# Patient Record
Sex: Male | Born: 1955 | Race: White | Hispanic: No | Marital: Married | Smoking: Former smoker
Health system: Southern US, Community
[De-identification: ages and names within clinical notes are randomized; demographics above are authoritative.]

## PROBLEM LIST (undated history)

## (undated) DIAGNOSIS — E119 Type 2 diabetes mellitus without complications: Secondary | ICD-10-CM

## (undated) DIAGNOSIS — E7800 Pure hypercholesterolemia, unspecified: Secondary | ICD-10-CM

## (undated) DIAGNOSIS — L88 Pyoderma gangrenosum: Secondary | ICD-10-CM

## (undated) DIAGNOSIS — M5430 Sciatica, unspecified side: Secondary | ICD-10-CM

## (undated) DIAGNOSIS — N200 Calculus of kidney: Secondary | ICD-10-CM

## (undated) DIAGNOSIS — F419 Anxiety disorder, unspecified: Secondary | ICD-10-CM

## (undated) DIAGNOSIS — I10 Essential (primary) hypertension: Secondary | ICD-10-CM

## (undated) DIAGNOSIS — K5190 Ulcerative colitis, unspecified, without complications: Secondary | ICD-10-CM

## (undated) HISTORY — PX: LEG SURGERY: SHX1003

## (undated) HISTORY — PX: BACK SURGERY: SHX140

---

## 2018-01-25 ENCOUNTER — Ambulatory Visit (HOSPITAL_COMMUNITY)
Admit: 2018-01-25 | Discharge: 2018-01-25 | Disposition: A | Discharge status: Home / Self Care | Payer: PRIVATE HEALTH INSURANCE | Attending: Internal Medicine | Admitting: Internal Medicine

## 2018-01-25 ENCOUNTER — Emergency Department (HOSPITAL_COMMUNITY): Payer: PRIVATE HEALTH INSURANCE

## 2018-01-25 ENCOUNTER — Inpatient Hospital Stay (HOSPITAL_COMMUNITY)
Admission: EM | Admit: 2018-01-25 | Discharge: 2018-01-28 | DRG: 386 | Disposition: A | Discharge status: Home / Self Care | Payer: PRIVATE HEALTH INSURANCE | Attending: Internal Medicine | Admitting: Internal Medicine

## 2018-01-25 ENCOUNTER — Other Ambulatory Visit: Payer: Self-pay

## 2018-01-25 ENCOUNTER — Other Ambulatory Visit (HOSPITAL_COMMUNITY): Payer: Self-pay

## 2018-01-25 ENCOUNTER — Encounter (HOSPITAL_COMMUNITY): Payer: Self-pay | Admitting: Emergency Medicine

## 2018-01-25 DIAGNOSIS — E11622 Type 2 diabetes mellitus with other skin ulcer: Secondary | ICD-10-CM | POA: Diagnosis not present

## 2018-01-25 DIAGNOSIS — E11649 Type 2 diabetes mellitus with hypoglycemia without coma: Secondary | ICD-10-CM | POA: Diagnosis not present

## 2018-01-25 DIAGNOSIS — L03116 Cellulitis of left lower limb: Secondary | ICD-10-CM | POA: Diagnosis present

## 2018-01-25 DIAGNOSIS — E785 Hyperlipidemia, unspecified: Secondary | ICD-10-CM | POA: Diagnosis present

## 2018-01-25 DIAGNOSIS — R748 Abnormal levels of other serum enzymes: Secondary | ICD-10-CM | POA: Diagnosis not present

## 2018-01-25 DIAGNOSIS — Z791 Long term (current) use of non-steroidal anti-inflammatories (NSAID): Secondary | ICD-10-CM

## 2018-01-25 DIAGNOSIS — L98499 Non-pressure chronic ulcer of skin of other sites with unspecified severity: Secondary | ICD-10-CM | POA: Diagnosis not present

## 2018-01-25 DIAGNOSIS — I110 Hypertensive heart disease with heart failure: Secondary | ICD-10-CM | POA: Diagnosis present

## 2018-01-25 DIAGNOSIS — I350 Nonrheumatic aortic (valve) stenosis: Secondary | ICD-10-CM | POA: Diagnosis present

## 2018-01-25 DIAGNOSIS — K922 Gastrointestinal hemorrhage, unspecified: Secondary | ICD-10-CM | POA: Diagnosis present

## 2018-01-25 DIAGNOSIS — E119 Type 2 diabetes mellitus without complications: Secondary | ICD-10-CM | POA: Diagnosis not present

## 2018-01-25 DIAGNOSIS — N179 Acute kidney failure, unspecified: Secondary | ICD-10-CM | POA: Diagnosis present

## 2018-01-25 DIAGNOSIS — I248 Other forms of acute ischemic heart disease: Secondary | ICD-10-CM | POA: Diagnosis present

## 2018-01-25 DIAGNOSIS — L080 Pyoderma: Secondary | ICD-10-CM | POA: Insufficient documentation

## 2018-01-25 DIAGNOSIS — Z87442 Personal history of urinary calculi: Secondary | ICD-10-CM | POA: Diagnosis not present

## 2018-01-25 DIAGNOSIS — Z9641 Presence of insulin pump (external) (internal): Secondary | ICD-10-CM | POA: Diagnosis present

## 2018-01-25 DIAGNOSIS — K254 Chronic or unspecified gastric ulcer with hemorrhage: Secondary | ICD-10-CM | POA: Diagnosis not present

## 2018-01-25 DIAGNOSIS — D62 Acute posthemorrhagic anemia: Secondary | ICD-10-CM | POA: Diagnosis present

## 2018-01-25 DIAGNOSIS — M79605 Pain in left leg: Secondary | ICD-10-CM | POA: Diagnosis present

## 2018-01-25 DIAGNOSIS — L88 Pyoderma gangrenosum: Secondary | ICD-10-CM | POA: Diagnosis present

## 2018-01-25 DIAGNOSIS — Z9111 Patient's noncompliance with dietary regimen: Secondary | ICD-10-CM

## 2018-01-25 DIAGNOSIS — R000 Tachycardia, unspecified: Secondary | ICD-10-CM | POA: Diagnosis present

## 2018-01-25 DIAGNOSIS — Z794 Long term (current) use of insulin: Secondary | ICD-10-CM

## 2018-01-25 DIAGNOSIS — F419 Anxiety disorder, unspecified: Secondary | ICD-10-CM

## 2018-01-25 DIAGNOSIS — I5032 Chronic diastolic (congestive) heart failure: Secondary | ICD-10-CM | POA: Diagnosis present

## 2018-01-25 DIAGNOSIS — Z7901 Long term (current) use of anticoagulants: Secondary | ICD-10-CM | POA: Diagnosis not present

## 2018-01-25 DIAGNOSIS — K51911 Ulcerative colitis, unspecified with rectal bleeding: Secondary | ICD-10-CM | POA: Diagnosis present

## 2018-01-25 DIAGNOSIS — I4891 Unspecified atrial fibrillation: Secondary | ICD-10-CM

## 2018-01-25 DIAGNOSIS — D649 Anemia, unspecified: Secondary | ICD-10-CM | POA: Diagnosis not present

## 2018-01-25 DIAGNOSIS — L539 Erythematous condition, unspecified: Secondary | ICD-10-CM | POA: Insufficient documentation

## 2018-01-25 DIAGNOSIS — E7800 Pure hypercholesterolemia, unspecified: Secondary | ICD-10-CM | POA: Diagnosis not present

## 2018-01-25 DIAGNOSIS — Z87891 Personal history of nicotine dependence: Secondary | ICD-10-CM

## 2018-01-25 DIAGNOSIS — I10 Essential (primary) hypertension: Secondary | ICD-10-CM

## 2018-01-25 DIAGNOSIS — I959 Hypotension, unspecified: Secondary | ICD-10-CM | POA: Diagnosis present

## 2018-01-25 DIAGNOSIS — I342 Nonrheumatic mitral (valve) stenosis: Secondary | ICD-10-CM | POA: Diagnosis not present

## 2018-01-25 DIAGNOSIS — K5190 Ulcerative colitis, unspecified, without complications: Secondary | ICD-10-CM | POA: Diagnosis not present

## 2018-01-25 DIAGNOSIS — M7989 Other specified soft tissue disorders: Secondary | ICD-10-CM | POA: Diagnosis present

## 2018-01-25 HISTORY — DX: Essential (primary) hypertension: I10

## 2018-01-25 HISTORY — DX: Type 2 diabetes mellitus without complications: E11.9

## 2018-01-25 HISTORY — DX: Pyoderma gangrenosum: L88

## 2018-01-25 HISTORY — DX: Anxiety disorder, unspecified: F41.9

## 2018-01-25 HISTORY — DX: Sciatica, unspecified side: M54.30

## 2018-01-25 HISTORY — DX: Calculus of kidney: N20.0

## 2018-01-25 HISTORY — DX: Pure hypercholesterolemia, unspecified: E78.00

## 2018-01-25 HISTORY — DX: Ulcerative colitis, unspecified, without complications: K51.90

## 2018-01-25 LAB — TSH: TSH: 4.913 u[IU]/mL — ABNORMAL HIGH (ref 0.350–4.500)

## 2018-01-25 LAB — CBC WITH DIFFERENTIAL/PLATELET
Basophils Absolute: 0 10*3/uL (ref 0.0–0.1)
Basophils Absolute: 0 10*3/uL (ref 0.0–0.1)
Basophils Relative: 0 %
Basophils Relative: 0 %
EOS ABS: 0 10*3/uL (ref 0.0–0.7)
EOS ABS: 0 10*3/uL (ref 0.0–0.7)
EOS PCT: 0 %
Eosinophils Relative: 0 %
HCT: 18.1 % — ABNORMAL LOW (ref 39.0–52.0)
HCT: 19.7 % — ABNORMAL LOW (ref 39.0–52.0)
Hemoglobin: 5.5 g/dL — CL (ref 13.0–17.0)
Hemoglobin: 6.5 g/dL — CL (ref 13.0–17.0)
LYMPHS ABS: 2.1 10*3/uL (ref 0.7–4.0)
Lymphocytes Relative: 23 %
Lymphocytes Relative: 12 %
Lymphs Abs: 1 10*3/uL (ref 0.7–4.0)
MCH: 33.3 pg (ref 26.0–34.0)
MCH: 33 pg (ref 26.0–34.0)
MCHC: 30.4 g/dL (ref 30.0–36.0)
MCHC: 33 g/dL (ref 30.0–36.0)
MCV: 109.7 fL — AB (ref 78.0–100.0)
MCV: 100 fL (ref 78.0–100.0)
MONO ABS: 1.5 10*3/uL — AB (ref 0.1–1.0)
MONO ABS: 1 10*3/uL (ref 0.1–1.0)
Monocytes Relative: 16 %
Monocytes Relative: 13 %
NEUTROS ABS: 6 10*3/uL (ref 1.7–7.7)
NEUTROS PCT: 61 %
Neutro Abs: 5.5 10*3/uL (ref 1.7–7.7)
Neutrophils Relative %: 75 %
PLATELETS: 300 10*3/uL (ref 150–400)
PLATELETS: 226 10*3/uL (ref 150–400)
RBC: 1.65 MIL/uL — ABNORMAL LOW (ref 4.22–5.81)
RBC: 1.97 MIL/uL — ABNORMAL LOW (ref 4.22–5.81)
RDW: 16.1 % — AB (ref 11.5–15.5)
RDW: 21.1 % — ABNORMAL HIGH (ref 11.5–15.5)
WBC: 9.1 10*3/uL (ref 4.0–10.5)
WBC: 8 10*3/uL (ref 4.0–10.5)

## 2018-01-25 LAB — BASIC METABOLIC PANEL
Anion gap: 13 (ref 5–15)
BUN: 44 mg/dL — AB (ref 6–20)
CALCIUM: 8.4 mg/dL — AB (ref 8.9–10.3)
CO2: 24 mmol/L (ref 22–32)
CREATININE: 1.5 mg/dL — AB (ref 0.61–1.24)
Chloride: 101 mmol/L (ref 101–111)
GFR calc Af Amer: 56 mL/min — ABNORMAL LOW (ref >60)
GFR, EST NON AFRICAN AMERICAN: 48 mL/min — AB (ref >60)
GLUCOSE: 119 mg/dL — AB (ref 65–99)
POTASSIUM: 3.5 mmol/L (ref 3.5–5.1)
Sodium: 138 mmol/L (ref 135–145)

## 2018-01-25 LAB — GLUCOSE, CAPILLARY
GLUCOSE-CAPILLARY: 246 mg/dL — AB (ref 65–99)
Glucose-Capillary: 189 mg/dL — ABNORMAL HIGH (ref 65–99)

## 2018-01-25 LAB — CBG MONITORING, ED
Glucose-Capillary: 98 mg/dL (ref 65–99)
Glucose-Capillary: 231 mg/dL — ABNORMAL HIGH (ref 65–99)

## 2018-01-25 LAB — POC OCCULT BLOOD, ED: Fecal Occult Bld: POSITIVE — AB

## 2018-01-25 LAB — ABO/RH: ABO/RH(D): O POS

## 2018-01-25 LAB — PREPARE RBC (CROSSMATCH)

## 2018-01-25 LAB — SEDIMENTATION RATE: SED RATE: 101 mm/h — AB (ref 0–16)

## 2018-01-25 MED ORDER — ALPRAZOLAM 0.5 MG PO TABS: 1.0000 mg | ORAL_TABLET | Freq: Two times a day (BID) | ORAL | Status: DC | PRN

## 2018-01-25 MED ORDER — CLINDAMYCIN PHOSPHATE 600 MG/50ML IV SOLN
600.0000 mg | Freq: Three times a day (TID) | INTRAVENOUS | Status: DC
  Administered 2018-01-25 – 2018-01-26 (×3): 600 mg via INTRAVENOUS
  Filled 2018-01-25 (×4): qty 50

## 2018-01-25 MED ORDER — LORAZEPAM 1 MG PO TABS
0.0000 mg | ORAL_TABLET | Freq: Two times a day (BID) | ORAL | Status: DC
  Filled 2018-01-25: qty 1

## 2018-01-25 MED ORDER — LIDOCAINE HCL 2 % EX GEL
1.0000 "application " | Freq: Once | CUTANEOUS | Status: AC
  Administered 2018-01-25: 1 via TOPICAL
  Filled 2018-01-25: qty 5

## 2018-01-25 MED ORDER — VITAMIN B-1 100 MG PO TABS
100.0000 mg | ORAL_TABLET | Freq: Every day | ORAL | Status: DC
  Administered 2018-01-25 – 2018-01-28 (×4): 100 mg via ORAL
  Filled 2018-01-25 (×4): qty 1

## 2018-01-25 MED ORDER — POLYETHYLENE GLYCOL 3350 17 G PO PACK: 17.0000 g | PACK | Freq: Every day | ORAL | Status: DC | PRN

## 2018-01-25 MED ORDER — SODIUM CHLORIDE 0.9 % IV BOLUS: 1000.0000 mL | Freq: Once | INTRAVENOUS | Status: DC

## 2018-01-25 MED ORDER — LORAZEPAM 2 MG/ML IJ SOLN: 1.0000 mg | Freq: Four times a day (QID) | INTRAMUSCULAR | Status: AC | PRN

## 2018-01-25 MED ORDER — LORAZEPAM 1 MG PO TABS
1.0000 mg | ORAL_TABLET | Freq: Four times a day (QID) | ORAL | Status: AC | PRN
  Administered 2018-01-25 (×2): 1 mg via ORAL
  Filled 2018-01-25 (×2): qty 1

## 2018-01-25 MED ORDER — HYDROMORPHONE HCL 1 MG/ML IJ SOLN
1.0000 mg | Freq: Once | INTRAMUSCULAR | Status: AC
  Administered 2018-01-25: 1 mg via INTRAVENOUS
  Filled 2018-01-25: qty 1

## 2018-01-25 MED ORDER — MERCAPTOPURINE 50 MG PO TABS
75.0000 mg | ORAL_TABLET | Freq: Every day | ORAL | Status: DC
  Administered 2018-01-25 – 2018-01-28 (×4): 75 mg via ORAL
  Filled 2018-01-25 (×4): qty 2

## 2018-01-25 MED ORDER — ACETAMINOPHEN 650 MG RE SUPP: 650.0000 mg | Freq: Four times a day (QID) | RECTAL | Status: DC | PRN

## 2018-01-25 MED ORDER — TACROLIMUS 0.1 % EX OINT
1.0000 "application " | TOPICAL_OINTMENT | Freq: Two times a day (BID) | CUTANEOUS | Status: DC
  Administered 2018-01-26 – 2018-01-27 (×3): 1 via TOPICAL

## 2018-01-25 MED ORDER — SODIUM CHLORIDE 0.9 % IV SOLN
10.0000 mL/h | Freq: Once | INTRAVENOUS | Status: AC
  Administered 2018-01-25: 10 mL/h via INTRAVENOUS

## 2018-01-25 MED ORDER — ACETAMINOPHEN 325 MG PO TABS: 650.0000 mg | ORAL_TABLET | Freq: Four times a day (QID) | ORAL | Status: DC | PRN

## 2018-01-25 MED ORDER — COLLAGENASE 250 UNIT/GM EX OINT
1.0000 "application " | TOPICAL_OINTMENT | Freq: Every day | CUTANEOUS | Status: DC
  Administered 2018-01-26 – 2018-01-27 (×2): 1 via TOPICAL
  Filled 2018-01-25 (×2): qty 90

## 2018-01-25 MED ORDER — THIAMINE HCL 100 MG/ML IJ SOLN: 100.0000 mg | Freq: Every day | INTRAMUSCULAR | Status: DC

## 2018-01-25 MED ORDER — ATORVASTATIN CALCIUM 10 MG PO TABS
10.0000 mg | ORAL_TABLET | Freq: Every day | ORAL | Status: DC
  Administered 2018-01-25 – 2018-01-28 (×4): 10 mg via ORAL
  Filled 2018-01-25 (×5): qty 1

## 2018-01-25 MED ORDER — HYDROCORTISONE 2.5 % EX CREA: 1.0000 "application " | TOPICAL_CREAM | Freq: Two times a day (BID) | CUTANEOUS | Status: DC

## 2018-01-25 MED ORDER — BUDESONIDE 3 MG PO CPEP
9.0000 mg | ORAL_CAPSULE | Freq: Every day | ORAL | Status: DC
  Administered 2018-01-25 – 2018-01-28 (×4): 9 mg via ORAL
  Filled 2018-01-25 (×4): qty 3

## 2018-01-25 MED ORDER — OXYCODONE HCL 5 MG PO TABS
5.0000 mg | ORAL_TABLET | ORAL | Status: DC | PRN
Start: 2018-01-25 — End: 2018-01-26
  Administered 2018-01-25 – 2018-01-26 (×6): 5 mg via ORAL
  Filled 2018-01-25 (×6): qty 1

## 2018-01-25 MED ORDER — INSULIN PUMP
1.0000 | SUBCUTANEOUS | Status: DC
  Administered 2018-01-25: 1 via SUBCUTANEOUS
  Filled 2018-01-25: qty 1

## 2018-01-25 MED ORDER — INSULIN PUMP
Freq: Three times a day (TID) | SUBCUTANEOUS | Status: DC
  Administered 2018-01-25 – 2018-01-26 (×2): via SUBCUTANEOUS
  Filled 2018-01-25: qty 1

## 2018-01-25 MED ORDER — HYDROCORTISONE 1 % EX CREA
TOPICAL_CREAM | Freq: Two times a day (BID) | CUTANEOUS | Status: DC
  Administered 2018-01-26: 1 via TOPICAL
  Administered 2018-01-27 (×2): via TOPICAL
  Filled 2018-01-25 (×2): qty 28

## 2018-01-25 MED ORDER — FOLIC ACID 1 MG PO TABS
1.0000 mg | ORAL_TABLET | Freq: Every day | ORAL | Status: DC
  Administered 2018-01-25 – 2018-01-28 (×4): 1 mg via ORAL
  Filled 2018-01-25 (×4): qty 1

## 2018-01-25 MED ORDER — ADULT MULTIVITAMIN W/MINERALS CH
1.0000 | ORAL_TABLET | Freq: Every day | ORAL | Status: DC
  Administered 2018-01-25 – 2018-01-28 (×4): 1 via ORAL
  Filled 2018-01-25 (×4): qty 1

## 2018-01-25 MED ORDER — GABAPENTIN 400 MG PO CAPS
800.0000 mg | ORAL_CAPSULE | Freq: Three times a day (TID) | ORAL | Status: DC
  Administered 2018-01-25 (×2): 800 mg via ORAL
  Filled 2018-01-25 (×3): qty 2

## 2018-01-25 MED ORDER — PANTOPRAZOLE SODIUM 40 MG PO TBEC
40.0000 mg | DELAYED_RELEASE_TABLET | Freq: Two times a day (BID) | ORAL | Status: DC
  Administered 2018-01-25 – 2018-01-28 (×6): 40 mg via ORAL
  Filled 2018-01-25 (×6): qty 1

## 2018-01-25 MED ORDER — LORAZEPAM 1 MG PO TABS
0.0000 mg | ORAL_TABLET | Freq: Four times a day (QID) | ORAL | Status: DC
  Administered 2018-01-27: 1 mg via ORAL
  Filled 2018-01-25: qty 1

## 2018-01-25 MED ORDER — BALSALAZIDE DISODIUM 750 MG PO CAPS
2250.0000 mg | ORAL_CAPSULE | Freq: Three times a day (TID) | ORAL | Status: DC
Start: 2018-01-25 — End: 2018-01-28
  Administered 2018-01-25 – 2018-01-28 (×8): 2250 mg via ORAL
  Filled 2018-01-25 (×13): qty 3

## 2018-01-25 MED ORDER — ONDANSETRON HCL 4 MG/2ML IJ SOLN: 4.0000 mg | Freq: Four times a day (QID) | INTRAMUSCULAR | Status: DC | PRN

## 2018-01-25 MED ORDER — SODIUM CHLORIDE 0.9% FLUSH
3.0000 mL | Freq: Two times a day (BID) | INTRAVENOUS | Status: DC
  Administered 2018-01-25 – 2018-01-28 (×6): 3 mL via INTRAVENOUS

## 2018-01-25 MED ORDER — ONDANSETRON HCL 4 MG/2ML IJ SOLN
4.0000 mg | Freq: Once | INTRAMUSCULAR | Status: AC
  Administered 2018-01-25: 4 mg via INTRAVENOUS
  Filled 2018-01-25: qty 2

## 2018-01-25 MED ORDER — ONDANSETRON HCL 4 MG PO TABS: 4.0000 mg | ORAL_TABLET | Freq: Four times a day (QID) | ORAL | Status: DC | PRN

## 2018-01-25 NOTE — ED Notes (Signed)
VASCULAR AT BEDSIDE

## 2018-01-25 NOTE — ED Notes (Signed)
Date and time results received: 01/25/18 11:41 AM  Dr. Malachi BondsShort notified regarding change in patient status (HR, BP). Patient will be instead going to a telemetry bed rather than med-surg.     Name of Provider Notified: Renae FickleMackenzie Short  Orders Received? Or Actions Taken?: see note  Selena BattenKim, RN from 3 W also notified regarding change.

## 2018-01-25 NOTE — Consult Note (Signed)
WOC Nurse wound consult note Reason for Consult:Chronic pyoderma lesions to left lateral lower leg and right lateral lower leg, present on admission.  Lives in [place], recent debridement of these lesions with increased, intense pain.  Both wounds are 100% adherent slough.  We will continue Santyl cream and tacrolimus ointment to periwound skin.  Wound type:PG lesions Pressure Injury POA: NA Measurement: Left lateral leg:  4.2 cm x 3 cm wound bed is 100% slough Right lateral leg:  2 cm x 1.4 cm 100% slough Wound WUJ:[id number]:adherent slough Drainage (amount, consistency, odor) minimal serosanguinous drainage with musty odor Periwound:Maroon hue to periwound skin with intense "electric' burning Dressing procedure/placement/frequency:Cleanse wounds to bilateral lower legs with NS.  Apply Santyl to wound bed.  Apply tacrolimus ointment to periwound.  Cover with NS moist 2x2.  Secure with ABD pad and kerlix/tape.  Change daily.  Will not follow at this time.  Please re-consult if needed.  Maple HudsonKaren Melisssa Donner RN BSN CWON Pager [phone]

## 2018-01-25 NOTE — ED Triage Notes (Signed)
Pt brought in by EMS for c/o leg pain  Pt recently had surgery on both of his legs and is c/o burning sensation to both legs  Pt is from [place] and is in town on business  EMS reports his initial blood sugar was 57 so they had him cut his insulin pump off and he ate some chocolate  CBG up to 84

## 2018-01-25 NOTE — H&P (Signed)
History and Physical    Luke King GNF:[id number] DOB: 14-Aug-1956 DOA: 01/25/2018  Referring MD/NP/PA: Dutch Quint PCP: System, Pcp Not In   Patient coming from: home (visiting from out of state on business)  Chief Complaint: left leg pain  HPI: Luke King is a 62 y.o. male with history of ulcerative colitis, pyoderma gangrenosum on Remicade, essential hypertension, hyperlipidemia, diabetes mellitus on an insulin pump, anxiety who presents with left lower extremity pain, weakness.  He is on xarelto which he states is for his pyoderma gangrenosum.  He denies history of DVT or PE.  The patient came down from [place] on business 4-5 days ago after having recently had a debridement of an ulcerated area on his left lower extremity.  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  He has also over the last 24 hours ha had progressive worsening stinging pain of the ulcer on the left lateral lower extremity with some mild increased swelling.  He denies fevers, chills, nausea.  He was so weak this morning that he came via EMS to the emergency department.  He has been taking his Xarelto as prescribed and for the last 5 days he has been taking Aleve 2 tabs 4 times a day.  He denies any black or tarry stools or obvious blood in his stools.  He denies abdominal pain.  He denies any other blood loss.  ED Course: Vital signs afebrile, pulse in the low 100s/sinus tachycardia, low normal blood pressures, breathing comfortably on room air although he was started on nasal cannula after he was found to be anemic.  Labs: Creatinine of 1.5 with unknown baseline, hemoglobin 5.5.  Rectal exam demonstrated dark brown stool that was occult positive.  X-ray of the left lower extremity demonstrated soft tissue defect but no evidence of osteomyelitis.  Initially he was going  to be admitted to a MedSurg bed but while in the emergency department, he was found to have a brief episode of A. fib with RVR.  He is also become progressively more hypotensive.  Review of Systems:   Complete 12 point review of systems reviewed with patient and negative except as mentioned above.    Past Medical History:  Diagnosis Date  . Anxiety   . Diabetes mellitus without complication (HCC)   . High cholesterol   . Hypertension   . Kidney stones   . Pyoderma gangrenosum   . Sciatica   . Ulcerative colitis [facility])     Past Surgical History:  Procedure Laterality Date  . BACK SURGERY    . LEG SURGERY       reports that he has quit smoking. He has never used smokeless tobacco. He reports that he drinks about 8.4 oz of alcohol per week. He reports that he does not use drugs.  Allergies  Allergen Reactions  . Contrast Media [Iodinated Diagnostic Agents] Anaphylaxis    Can't breathe/throat closes up  . Penicillins Anaphylaxis  . Red Dye Anaphylaxis    Family History  Problem Relation Age of Onset  . Cancer Mother   . Cancer Father   . Ulcerative colitis Father     Prior to Admission medications   Medication Sig Start Date End Date Taking? Authorizing Provider  ALPRAZolam Prudy Feeler) 1 MG tablet Take 1 mg by mouth 2 (two) times daily.   Yes  [provider]  aspirin 325 MG tablet Take 325 mg by mouth daily.   Yes [provider]  atorvastatin (LIPITOR) 10 MG tablet Take 10 mg by mouth daily.   Yes [provider]  balsalazide (COLAZAL) 750 MG capsule Take 2,250 mg by mouth 3 (three) times daily.   Yes [provider]  budesonide (ENTOCORT EC) 3 MG 24 hr capsule Take 9 mg by mouth daily.   Yes [provider]  bumetanide (BUMEX) 1 MG tablet Take 1 mg by mouth 2 (two) times daily.   Yes [provider]  collagenase (SANTYL) ointment Apply 1 application topically daily.   Yes [provider]  gabapentin (NEURONTIN)  800 MG tablet Take 800 mg by mouth 3 (three) times daily.   Yes [provider]  hydrocortisone 2.5 % cream Apply 1 application topically 2 (two) times daily.   Yes [provider]  Insulin Human (INSULIN PUMP) SOLN Inject 1 each into the skin continuous. humalog 5 units evey hour . Pt inj 140 units daily   Yes [provider]  lisinopril-hydrochlorothiazide (PRINZIDE,ZESTORETIC) 10-12.5 MG tablet Take 1 tablet by mouth daily.   Yes [provider]  mercaptopurine (PURINETHOL) 50 MG tablet Take 75 mg by mouth daily. Give on an empty stomach 1 hour before or 2 hours after meals. Caution: Chemotherapy.   Yes [provider]  naproxen sodium (ALEVE) 220 MG tablet Take 440 mg by mouth 2 (two) times daily as needed (pain).   Yes [provider]  potassium chloride (K-DUR,KLOR-CON) 10 MEQ tablet Take 10 mEq by mouth 2 (two) times daily.   Yes [provider]  rivaroxaban (XARELTO) 20 MG TABS tablet Take 20 mg by mouth daily.   Yes [provider]  tacrolimus (PROTOPIC) 0.1 % ointment Apply 1 application topically 2 (two) times daily.   Yes [provider]    Physical Exam: Vitals:   01/25/18 1101 01/25/18 1120 01/25/18 1130 01/25/18 1200  BP: 95/77  98/68 90/65  Pulse: (!) 145 (!) 111 (!) 110 (!) 110  Resp: 19 17 13 20   Temp: 97.9 F (36.6 C)     TempSrc: Oral     SpO2: 99% 95% 97% 97%    Constitutional: NAD, calm, comfortable, words are slightly slurred Eyes: PERRL, lids and conjunctivae normal ENMT:  Moist mucous membranes.  Oropharynx nonerythematous, no exudates.   Neck:  No nuchal rigidity, no masses Respiratory:  No wheezes, rales, or rhonchi Cardiovascular: Mildly tachycardic, regular rhythm, no murmurs / rubs / gallops.  2+ radial pulses. Abdomen:  Normal active bowel sounds, soft, nondistended, nontender Musculoskeletal: Normal muscle tone and bulk.  No contractures.  Skin: He has a 3 cm ulcer on the  right lateral leg.  There is a less than 1 cm ulcer on the first MTP joint of the left foot.  There is a large ulcer on the left lateral lower extremity with a small amount of necrotic material and a lot of slough.  The left lower extremity has 1+ pitting edema and there is a 2cm rim of erythema which may suggest early infection/cellulitis. Neurologic:  CN 2-12 grossly intact. Sensation intact to light touch, strength 5/5 throughout Psychiatric:  Alert and oriented x 3.  Somewhat slurred speech.  He perseverates on the need to get back to [place] and does not want to stay in the hospital for long.           Labs on Admission: I have  personally reviewed following labs and imaging studies  CBC: Recent Labs  Lab 01/25/18 0312  WBC 9.1  NEUTROABS 5.5  HGB 5.5*  HCT 18.1*  MCV 109.7*  PLT 300   Basic Metabolic Panel: Recent Labs  Lab 01/25/18 0312  NA 138  K 3.5  CL 101  CO2 24  GLUCOSE 119*  BUN 44*  CREATININE 1.50*  CALCIUM 8.4*   GFR: CrCl cannot be calculated (Unknown ideal weight.). Liver Function Tests: No results for input(s): AST, ALT, ALKPHOS, BILITOT, PROT, ALBUMIN in the last 168 hours. No results for input(s): LIPASE, AMYLASE in the last 168 hours. No results for input(s): AMMONIA in the last 168 hours. Coagulation Profile: No results for input(s): INR, PROTIME in the last 168 hours. Cardiac Enzymes: No results for input(s): CKTOTAL, CKMB, CKMBINDEX, TROPONINI in the last 168 hours. BNP (last 3 results) No results for input(s): PROBNP in the last 8760 hours. HbA1C: No results for input(s): HGBA1C in the last 72 hours. CBG: Recent Labs  Lab 01/25/18 0227 01/25/18 1100  GLUCAP 98 231*   Lipid Profile: No results for input(s): CHOL, HDL, LDLCALC, TRIG, CHOLHDL, LDLDIRECT in the last 72 hours. Thyroid Function Tests: No results for input(s): TSH, T4TOTAL, FREET4, T3FREE, THYROIDAB in the last 72 hours. Anemia Panel: No results for input(s):  VITAMINB12, FOLATE, FERRITIN, TIBC, IRON, RETICCTPCT in the last 72 hours. Urine analysis: No results found for: COLORURINE, APPEARANCEUR, LABSPEC, PHURINE, GLUCOSEU, HGBUR, BILIRUBINUR, KETONESUR, PROTEINUR, UROBILINOGEN, NITRITE, LEUKOCYTESUR Sepsis Labs: @LABRCNTIP (procalcitonin:4,lacticidven:4) )No results found for this or any previous visit (from the past 240 hour(s)).   Radiological Exams on Admission: Dg Tibia/fibula Left  Result Date: 01/25/2018 CLINICAL DATA:  62 y/o M; nonhealing wound distal to the lateral aspect of the distal fibula. EXAM: LEFT TIBIA AND FIBULA - 2 VIEW COMPARISON:  None. FINDINGS: There is no evidence of fracture or other focal bone lesions. Soft tissue defect lateral to distal fibula. Extensive vascular calcifications. Partially visualized knee and ankle joints appear well maintained. IMPRESSION: Soft tissue defect lateral to the distal fibula. No findings of osteomyelitis. Electronically Signed   By: Mitzi Hansen M.D.   On: 01/25/2018 03:06    EKG: Independently reviewed.  Sinus tachycardia.  He has maybe 1/2 mm of ST segment elevation in V1 and a half a millimeter ST segment depression in leads V3, V4 and V5.  His QTC is 497.  No EKGs in our system for comparison.  Assessment/Plan  Active Problems:   Symptomatic anemia   Symptomatic anemia due to acute blood loss in the setting of known ulcerative colitis and anticoagulation with heavy NSAID use over the last week.  He has a relatively high BUN which could suggest that this is gastric bleeding from an ulcer or gastritis from his NSAID use. - Start PPI - Clear liquid diet -Type and screen, transfuse 2 units PRBCs with a posttransfusion H&H -Hold NSAIDs and anticoagulation -If he continues to bleed he will need a GI consultation here in [place], however if his bleeding stops perhaps he could be discharged with twice daily PPI with outpatient follow-up with his own gastroenterologist up in [place]  Left lower extremity swelling and pain and possible early cellulitis - duplex LLE:  Neg for DVT - start ancef -Elevate left lower extremity -Wound care consultation -Continue hydrocortisone, tacrolimus, and Santyl creams -Continue gabapentin -Tylenol with oxycodone for breakthrough pain -Avoid NSAIDs  A. fib with RVR, likely triggered by infection versus anemia -Telemetry -Cycle troponins -TSH -  Echocardiogram -No anticoagulation secondary to anemia/occult positive stool  Suspect he has some chronic congestive heart failure, unclear at this point if diastolic or systolic as he is on Bumex -hold Bumex while hypotensive  Essential hypertension, hyperlipidemia, blood pressure is low/mildly hypotensive -Hold blood pressure medications -Continue atorvastatin  Ulcerative colitis -Continue balsalazide, budesonide, 6-MP  Diabetes mellitus type 2, on an insulin pump.  Patient to continue his insulin pump  Chronic daily alcohol use -CIWA protocol with thiamine, folate and multivitamin  CKD vs. AKI -  IVF, blood transfusion -  Repeat BMP in AM  DVT prophylaxis: SCDs  Code Status: full Family Communication:  Patient alone Disposition Plan: home in a few days  Consults called: none  Admission status: inpatient, tele   Renae Fickle MD [facility] Pager [phone]  If 7PM-7AM, please contact night-coverage [email or web address] Password [facility]  01/25/2018, 12:25 PM

## 2018-01-25 NOTE — ED Notes (Addendum)
Vascular technician at bedside. Upon placing patient back on the cardiac monitor, a HR of 145-160 was noted. This Clinical research associatewriter was called to the bedside along with Morrie SheldonAshley, Charity fundraiserN. Complete vital signs obtained, CBG checked and EKG obtained. See chart. Patient conscious, alert and oriented and no other acute changes noticed at this time. Approximately 2-3 minutes later, patient's HR returned to 110-117 bpm which has been the patient's baseline. Hospitalist has been paged. Will notify provider of these changes before sending the patient up to the 3rd floor. Waiting on response from provider. Dressing change completed by Morrie SheldonAshley, RN (see chart).

## 2018-01-25 NOTE — ED Notes (Signed)
Pt ambulatory to restroom at this time. 

## 2018-01-25 NOTE — Progress Notes (Signed)
Preliminary notes by tech--Left lower extremity venous duplex study completed. Negative for DVT. Calcification noted in lower extremity arterials. Result notified RN Chelsay.   Hongying Kellin Bartling(RDMS RVT) 01/25/18 11:07 AM

## 2018-01-25 NOTE — ED Provider Notes (Signed)
[facility]-EMERGENCY DEPT Provider Note   CSN: [id number] Arrival date & time: 01/25/18  0200     History   Chief Complaint Chief Complaint  Patient presents with  . Leg Pain    HPI Luke King is a 62 y.o. male.  Patient presents to the ER for evaluation of severe burning pain of his left lower extremity.  Patient reports chronic wounds of both of his lower legs near the ankles.  He is in the area on business, lives in [place].  He has a wound care doctor as well as a dermatologist.  He is being treated for pyoderma gangrenosum.  He reports that before he left [place] this week, his wound care doctor "scraped" the wound on his left leg.  Since then he has been having increased pain.  He has been using Aleve without improvement.     Past Medical History:  Diagnosis Date  . Anxiety   . Diabetes mellitus without complication (HCC)   . High cholesterol   . Hypertension   . Kidney stones   . Sciatica   . Ulcerative colitis (HCC)     There are no active problems to display for this patient.   Past Surgical History:  Procedure Laterality Date  . BACK SURGERY    . LEG SURGERY          Home Medications    Prior to Admission medications   Medication Sig Start Date End Date Taking? Authorizing Provider  ALPRAZolam Prudy Feeler) 1 MG tablet Take 1 mg by mouth 2 (two) times daily.   Yes [provider]  aspirin 325 MG tablet Take 325 mg by mouth daily.   Yes [provider]  atorvastatin (LIPITOR) 10 MG tablet Take 10 mg by mouth daily.   Yes [provider]  balsalazide (COLAZAL) 750 MG capsule Take 2,250 mg by mouth 3 (three) times daily.   Yes [provider]  budesonide (ENTOCORT EC) 3 MG 24 hr capsule Take 9 mg by mouth daily.   Yes [provider]  bumetanide (BUMEX) 1 MG tablet Take 1 mg by mouth 2 (two) times daily.   Yes [provider]  collagenase (SANTYL) ointment Apply 1 application  topically daily.   Yes [provider]  gabapentin (NEURONTIN) 800 MG tablet Take 800 mg by mouth 3 (three) times daily.   Yes [provider]  hydrocortisone 2.5 % cream Apply 1 application topically 2 (two) times daily.   Yes [provider]  Insulin Human (INSULIN PUMP) SOLN Inject 1 each into the skin continuous. humalog 5 units evey hour . Pt inj 140 units daily   Yes [provider]  lisinopril-hydrochlorothiazide (PRINZIDE,ZESTORETIC) 10-12.5 MG tablet Take 1 tablet by mouth daily.   Yes [provider]  mercaptopurine (PURINETHOL) 50 MG tablet Take 75 mg by mouth daily. Give on an empty stomach 1 hour before or 2 hours after meals. Caution: Chemotherapy.   Yes [provider]  naproxen sodium (ALEVE) 220 MG tablet Take 440 mg by mouth 2 (two) times daily as needed (pain).   Yes [provider]  potassium chloride (K-DUR,KLOR-CON) 10 MEQ tablet Take 10 mEq by mouth 2 (two) times daily.   Yes [provider]  rivaroxaban (XARELTO) 20 MG TABS tablet Take 20 mg by mouth daily.   Yes [provider]  tacrolimus (PROTOPIC) 0.1 % ointment Apply 1 application topically 2 (two) times daily.   Yes [provider]  Family History Family History  Problem Relation Age of Onset  . Cancer Mother   . Cancer Father   . Ulcerative colitis Father     Social History Social History   Tobacco Use  . Smoking status: Never Smoker  . Smokeless tobacco: Never Used  Substance Use Topics  . Alcohol use: Yes    Comment: daily  . Drug use: Never     Allergies   Penicillins and Red dye   Review of Systems Review of Systems  Skin: Positive for wound.  All other systems reviewed and are negative.    Physical Exam Updated Vital Signs BP 103/66   Pulse 96   Temp 98.3 F (36.8 C) (Oral)   Resp 16   SpO2 100%   Physical Exam  Constitutional: He is oriented to person, place, and time. He appears  well-developed and well-nourished. No distress.  HENT:  Head: Normocephalic and atraumatic.  Right Ear: Hearing normal.  Left Ear: Hearing normal.  Nose: Nose normal.  Mouth/Throat: Oropharynx is clear and moist and mucous membranes are normal.  Eyes: Pupils are equal, round, and reactive to light. Conjunctivae and EOM are normal.  Neck: Normal range of motion. Neck supple.  Cardiovascular: Regular rhythm, S1 normal and S2 normal. Exam reveals no gallop and no friction rub.  No murmur heard. Pulmonary/Chest: Effort normal and breath sounds normal. No respiratory distress. He exhibits no tenderness.  Abdominal: Soft. Normal appearance and bowel sounds are normal. There is no hepatosplenomegaly. There is no tenderness. There is no rebound, no guarding, no tenderness at McBurney's point and negative Murphy's sign. No hernia.  Musculoskeletal: Normal range of motion.  Neurological: He is alert and oriented to person, place, and time. He has normal strength. No cranial nerve deficit or sensory deficit. Coordination normal. GCS eye subscore is 4. GCS verbal subscore is 5. GCS motor subscore is 6.  Skin: Skin is warm, dry and intact. No rash noted. No cyanosis.  Well-healing area on the right lateral lower leg that does not appear infected.  Large oval-shaped opening lateral aspect of left lower leg with small area of bleeding at the inferior aspect.  No surrounding erythema, induration.  No drainage.  Psychiatric: He has a normal mood and affect. His speech is normal and behavior is normal. Thought content normal.  Nursing note and vitals reviewed.    ED Treatments / Results  Labs (all labs ordered are listed, but only abnormal results are displayed) Labs Reviewed  CBC WITH DIFFERENTIAL/PLATELET - Abnormal; Notable for the following components:      Result Value   RBC 1.65 (*)    Hemoglobin 5.5 (*)    HCT 18.1 (*)    MCV 109.7 (*)    RDW 16.1 (*)    Monocytes Absolute 1.5 (*)    All  other components within normal limits  BASIC METABOLIC PANEL - Abnormal; Notable for the following components:   Glucose, Bld 119 (*)    BUN 44 (*)    Creatinine, Ser 1.50 (*)    Calcium 8.4 (*)    GFR calc non Af Amer 48 (*)    GFR calc Af Amer 56 (*)    All other components within normal limits  POC OCCULT BLOOD, ED - Abnormal; Notable for the following components:   Fecal Occult Bld POSITIVE (*)    All other components within normal limits  CBG MONITORING, ED  PREPARE RBC (CROSSMATCH)    EKG None  Radiology Dg Tibia/fibula Left  Result Date: 01/25/2018 CLINICAL DATA:  62 y/o M; nonhealing wound distal to the lateral aspect of the distal fibula. EXAM: LEFT TIBIA AND FIBULA - 2 VIEW COMPARISON:  None. FINDINGS: There is no evidence of fracture or other focal bone lesions. Soft tissue defect lateral to distal fibula. Extensive vascular calcifications. Partially visualized knee and ankle joints appear well maintained. IMPRESSION: Soft tissue defect lateral to the distal fibula. No findings of osteomyelitis. Electronically Signed   By: Mitzi Hansen M.D.   On: 01/25/2018 03:06    Procedures Procedures (including critical care time)  Medications Ordered in ED Medications  0.9 %  sodium chloride infusion (has no administration in time range)  HYDROmorphone (DILAUDID) injection 1 mg (1 mg Intravenous Given 01/25/18 0310)  ondansetron (ZOFRAN) injection 4 mg (4 mg Intravenous Given 01/25/18 0309)  lidocaine (XYLOCAINE) 2 % jelly 1 application (1 application Topical Given 01/25/18 0310)     Initial Impression / Assessment and Plan / ED Course  I have reviewed the triage vital signs and the nursing notes.  Pertinent labs & imaging results that were available during my care of the patient were reviewed by me and considered in my medical decision making (see chart for details).     Patient presents with complaints of leg pain.  He has ongoing issues of chronic wound secondary to  pyoderma gangrenosum.  Patient experiencing a lot of pain in this area, but the wound appears to be chronic and stable.  There is no erythema or drainage.  No signs of active infection.  X-ray unremarkable.  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  He was provided with analgesia and has done well.  Routine blood work was performed on the patient because he reported that he was taking a lot of Aleve.  A lot of the make sure that he did not have any significant renal dysfunction.  Additionally, a CBC was checked to help rule out infection.  X-ray did not show any gas formation or signs of serious infection or osteomyelitis.  He does not have any significant leukocytosis, but is profoundly anemic.  Patient does tell me that he was having difficulty walking around at work last night, initially thought it was secondary to the pain in his legs, but likely there was some symptomatic anemia as well.  Patient's wound care doctor placed him on Xarelto to help manage his chronic wounds.  He does tell me that he has a history of ulcerative colitis.  He has not noticed any bleeding, but his Hemoccult was positive here in the ER.  I suspect he has had ongoing GI bleeding secondary to the Xarelto resulting in his anemia.  He will require blood transfusion and admission.  Final Clinical Impressions(s) / ED Diagnoses   Final diagnoses:  Symptomatic anemia  Gastrointestinal hemorrhage, unspecified gastrointestinal hemorrhage type  Pyoderma gangrenosum    ED Discharge Orders    None       Pollina, Canary Brim, MD 01/25/18 [phone]

## 2018-01-25 NOTE — Progress Notes (Signed)
Inpatient Diabetes Program Recommendations  AACE/ADA: New Consensus Statement on Inpatient Glycemic Control (2015)  Target Ranges:  Prepandial:   less than 140 mg/dL      Peak postprandial:   less than 180 mg/dL (1-2 hours)      Critically ill patients:  140 - 180 mg/dL   Lab Results  Component Value Date   GLUCAP 231 (H) 01/25/2018    Review of Glycemic Control  Diabetes history: DM2 Outpatient Diabetes medications: Insulin pump (see below for rates) Current orders for Inpatient glycemic control: Insulin pump  Settings: Basal - 12MN - 8AM - 3 units/hour               8AM - 12 MN - 5 units/hour Bolus - 5 units at B, L, D  Pt states HgbA1C is 9.5%.  Inpatient Diabetes Program Recommendations:     Continue with insulin pump.   Spoke with pt briefly about his insulin pump, hypoglycemia s/s and treatment, importance of reducing HgbA1C to prevent long-term complications. Pt does not CHO count. Boluses set amount for meals. Pt states he drinks 2-3 alcoholic drinks with dinner daily.  Will have pt sign consent form.   Follow-up in am.   Thank you. Ailene Ardshonda Kaysen Sefcik, RD, LDN, CDE Inpatient Diabetes Coordinator [phone]

## 2018-01-26 ENCOUNTER — Inpatient Hospital Stay (HOSPITAL_COMMUNITY): Payer: PRIVATE HEALTH INSURANCE

## 2018-01-26 DIAGNOSIS — K254 Chronic or unspecified gastric ulcer with hemorrhage: Secondary | ICD-10-CM

## 2018-01-26 DIAGNOSIS — I342 Nonrheumatic mitral (valve) stenosis: Secondary | ICD-10-CM

## 2018-01-26 DIAGNOSIS — R748 Abnormal levels of other serum enzymes: Secondary | ICD-10-CM

## 2018-01-26 LAB — GLUCOSE, CAPILLARY
GLUCOSE-CAPILLARY: 50 mg/dL — AB (ref 65–99)
GLUCOSE-CAPILLARY: 80 mg/dL (ref 65–99)
GLUCOSE-CAPILLARY: 146 mg/dL — AB (ref 65–99)
Glucose-Capillary: 71 mg/dL (ref 65–99)
Glucose-Capillary: 128 mg/dL — ABNORMAL HIGH (ref 65–99)
Glucose-Capillary: 144 mg/dL — ABNORMAL HIGH (ref 65–99)

## 2018-01-26 LAB — BASIC METABOLIC PANEL
ANION GAP: 12 (ref 5–15)
Anion gap: 9 (ref 5–15)
BUN: 24 mg/dL — ABNORMAL HIGH (ref 6–20)
BUN: 18 mg/dL (ref 6–20)
CHLORIDE: 105 mmol/L (ref 101–111)
CO2: 25 mmol/L (ref 22–32)
CO2: 25 mmol/L (ref 22–32)
CREATININE: 1.07 mg/dL (ref 0.61–1.24)
Calcium: 8.3 mg/dL — ABNORMAL LOW (ref 8.9–10.3)
Calcium: 8.5 mg/dL — ABNORMAL LOW (ref 8.9–10.3)
Chloride: 102 mmol/L (ref 101–111)
Creatinine, Ser: 0.95 mg/dL (ref 0.61–1.24)
GFR calc Af Amer: >60 mL/min (ref >60)
GFR calc non Af Amer: >60 mL/min (ref >60)
Glucose, Bld: 199 mg/dL — ABNORMAL HIGH (ref 65–99)
Glucose, Bld: 77 mg/dL (ref 65–99)
POTASSIUM: 4.4 mmol/L (ref 3.5–5.1)
Potassium: 3.6 mmol/L (ref 3.5–5.1)
SODIUM: 139 mmol/L (ref 135–145)
Sodium: 139 mmol/L (ref 135–145)

## 2018-01-26 LAB — HEMOGLOBIN A1C
HEMOGLOBIN A1C: 5.9 % — AB (ref 4.8–5.6)
Mean Plasma Glucose: 122.63 mg/dL

## 2018-01-26 LAB — CBC
HEMATOCRIT: 23.8 % — AB (ref 39.0–52.0)
Hemoglobin: 7.7 g/dL — ABNORMAL LOW (ref 13.0–17.0)
MCH: 32.4 pg (ref 26.0–34.0)
MCHC: 32.4 g/dL (ref 30.0–36.0)
MCV: 100 fL (ref 78.0–100.0)
Platelets: 238 10*3/uL (ref 150–400)
RBC: 2.38 MIL/uL — AB (ref 4.22–5.81)
RDW: 21.1 % — AB (ref 11.5–15.5)
WBC: 9.8 10*3/uL (ref 4.0–10.5)

## 2018-01-26 LAB — PREALBUMIN: PREALBUMIN: 18.9 mg/dL (ref 18–38)

## 2018-01-26 LAB — C-REACTIVE PROTEIN: CRP: 1.6 mg/dL — AB (ref <1.0)

## 2018-01-26 LAB — TROPONIN I
Troponin I: 0.03 ng/mL (ref <0.03)
Troponin I: <0.03 ng/mL (ref <0.03)

## 2018-01-26 LAB — ECHOCARDIOGRAM COMPLETE
Height: 69 in
WEIGHTICAEL: 3248.7 [oz_av]

## 2018-01-26 LAB — HIV ANTIBODY (ROUTINE TESTING W REFLEX): HIV SCREEN 4TH GENERATION: NONREACTIVE

## 2018-01-26 LAB — PREPARE RBC (CROSSMATCH)

## 2018-01-26 MED ORDER — VANCOMYCIN HCL 10 G IV SOLR
1500.0000 mg | Freq: Once | INTRAVENOUS | Status: AC
  Administered 2018-01-26: 1500 mg via INTRAVENOUS
  Filled 2018-01-26: qty 1500

## 2018-01-26 MED ORDER — OXYCODONE-ACETAMINOPHEN 5-325 MG PO TABS
1.0000 | ORAL_TABLET | ORAL | Status: DC | PRN
  Administered 2018-01-26 – 2018-01-27 (×4): 2 via ORAL
  Filled 2018-01-26 (×4): qty 2

## 2018-01-26 MED ORDER — INSULIN DETEMIR 100 UNIT/ML ~~LOC~~ SOLN
50.0000 [IU] | Freq: Two times a day (BID) | SUBCUTANEOUS | Status: DC
  Administered 2018-01-26 (×2): 50 [IU] via SUBCUTANEOUS
  Filled 2018-01-26 (×4): qty 0.5

## 2018-01-26 MED ORDER — GABAPENTIN 400 MG PO CAPS
800.0000 mg | ORAL_CAPSULE | Freq: Four times a day (QID) | ORAL | Status: DC
  Administered 2018-01-26 – 2018-01-28 (×9): 800 mg via ORAL
  Filled 2018-01-26 (×8): qty 2

## 2018-01-26 MED ORDER — HYDROMORPHONE HCL 1 MG/ML IJ SOLN
1.0000 mg | Freq: Once | INTRAMUSCULAR | Status: AC
  Administered 2018-01-26: 1 mg via INTRAVENOUS
  Filled 2018-01-26: qty 1

## 2018-01-26 MED ORDER — SODIUM CHLORIDE 0.9 % IV SOLN
2.0000 g | INTRAVENOUS | Status: DC
  Administered 2018-01-26 – 2018-01-27 (×2): 2 g via INTRAVENOUS
  Filled 2018-01-26: qty 2
  Filled 2018-01-26: qty 20
  Filled 2018-01-26: qty 2
  Filled 2018-01-26: qty 20

## 2018-01-26 MED ORDER — INSULIN ASPART 100 UNIT/ML ~~LOC~~ SOLN
0.0000 [IU] | SUBCUTANEOUS | Status: DC
  Administered 2018-01-26 – 2018-01-27 (×4): 1 [IU] via SUBCUTANEOUS

## 2018-01-26 MED ORDER — MORPHINE SULFATE (PF) 4 MG/ML IV SOLN
2.0000 mg | Freq: Once | INTRAVENOUS | Status: AC
  Administered 2018-01-26: 2 mg via INTRAVENOUS
  Filled 2018-01-26: qty 1

## 2018-01-26 MED ORDER — VANCOMYCIN HCL 10 G IV SOLR
1750.0000 mg | INTRAVENOUS | Status: DC
  Administered 2018-01-27: 1750 mg via INTRAVENOUS
  Filled 2018-01-26 (×2): qty 1750

## 2018-01-26 NOTE — Consult Note (Addendum)
Cardiology Consultation:   Patient ID: Luke King; [id number]; June 26, 1956   Admit date: 01/25/2018 Date of Consult: 01/26/2018  Primary Care Provider: System, Pcp Not In Primary Cardiologist: No primary care provider on file.  Cardiologist in [place] Primary Electrophysiologist: [place]   Patient Profile:   Luke King is a 62 y.o. male with a hx of diabetes, on anticoagulation who is being seen today for the evaluation of ? left atrial thrombus at the request of Dr. Quincy Simmonds.  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  Rectal exam showed occult positive stool during admission.  Mildly tachycardic low 100s.  Brief episode of atrial fibrillation with rapid ventricular response noted.  He was mildly hypotensive.  Bumex was on hold.  Blood pressure medications also on hold.  Now no CP, no SOB. Comfortable in bed. No CVA history. No history of AFIB he states.    Past Medical History:  Diagnosis Date  . Anxiety   . Diabetes mellitus without complication ([place])   . High cholesterol   . Hypertension   . Kidney stones   . Pyoderma gangrenosum   . Sciatica   . Ulcerative colitis [facility])     Past Surgical History:  Procedure Laterality Date  . BACK SURGERY    . LEG SURGERY       Home Medications:  Prior to Admission medications   Medication Sig Start Date End Date Taking? Authorizing Provider  ALPRAZolam Duanne Moron) 1 MG tablet Take 1 mg by mouth 2 (two)  times daily.   Yes [provider]  aspirin 325 MG tablet Take 325 mg by mouth daily.   Yes [provider]  atorvastatin (LIPITOR) 10 MG tablet Take 10 mg by mouth daily.   Yes [provider]  balsalazide (COLAZAL) 750 MG capsule Take 2,250 mg by mouth 3 (three) times daily.   Yes [provider]  budesonide (ENTOCORT EC) 3 MG 24 hr capsule Take 9 mg by mouth daily.   Yes [provider]  bumetanide (BUMEX) 1 MG tablet Take 1 mg by mouth 2 (two) times daily.   Yes [provider]  collagenase (SANTYL) ointment Apply 1 application topically daily.   Yes [provider]  gabapentin (NEURONTIN) 800 MG tablet Take 800 mg by mouth 3 (three) times daily.   Yes [provider]  hydrocortisone 2.5 % cream Apply 1 application topically 2 (two) times daily.   Yes [provider]  Insulin Human (INSULIN PUMP) SOLN Inject 1 each into the skin continuous. humalog 5 units evey hour . Pt inj 140 units daily   Yes [provider]  lisinopril-hydrochlorothiazide (PRINZIDE,ZESTORETIC) 10-12.5 MG tablet Take 1 tablet by mouth daily.   Yes [provider]  mercaptopurine (PURINETHOL) 50 MG tablet Take 75 mg by mouth daily. Give on an empty stomach 1 hour before or 2 hours after meals. Caution: Chemotherapy.   Yes [provider]  naproxen sodium (ALEVE) 220 MG tablet Take 440 mg by mouth 2 (two)  times daily as needed (pain).   Yes [provider]  potassium chloride (K-DUR,KLOR-CON) 10 MEQ tablet Take 10 mEq by mouth 2 (two) times daily.   Yes [provider]  rivaroxaban (XARELTO) 20 MG TABS tablet Take 20 mg by mouth daily.   Yes [provider]  tacrolimus (PROTOPIC) 0.1 % ointment Apply 1 application topically 2 (two) times daily.   Yes [provider]    Inpatient Medications: Scheduled Meds: . atorvastatin  10 mg Oral Daily  . balsalazide  2,250 mg Oral TID  .  budesonide  9 mg Oral Daily  . collagenase  1 application Topical Daily  . folic acid  1 mg Oral Daily  . gabapentin  800 mg Oral QID  . hydrocortisone cream   Topical BID  . insulin aspart  0-9 Units Subcutaneous Q4H  . insulin detemir  50 Units Subcutaneous BID  . LORazepam  0-4 mg Oral Q6H   Followed by  . [START ON 01/27/2018] LORazepam  0-4 mg Oral Q12H  . mercaptopurine  75 mg Oral Daily  . multivitamin with minerals  1 tablet Oral Daily  . pantoprazole  40 mg Oral BID AC  . sodium chloride flush  3 mL Intravenous Q12H  . tacrolimus  1 application Topical BID  . thiamine  100 mg Oral Daily   Or  . thiamine  100 mg Intravenous Daily   Continuous Infusions: . sodium chloride    . vancomycin 1,500 mg (01/26/18 1146)   PRN Meds: acetaminophen **OR** acetaminophen, LORazepam **OR** LORazepam, ondansetron **OR** ondansetron (ZOFRAN) IV, oxyCODONE, polyethylene glycol  Allergies:    Allergies  Allergen Reactions  . Contrast Media [Iodinated Diagnostic Agents] Anaphylaxis    Can't breathe/throat closes up  . Penicillins Anaphylaxis  . Red Dye Anaphylaxis    Social History:   Social History   Socioeconomic History  . Marital status: Married    Spouse name: Not on file  . Number of children: Not on file  . Years of education: Not on file  . Highest education level: Not on file  Occupational History  . Not on file  Social Needs  . Financial resource strain: Not on file  . Food insecurity:    Worry: Not on file    Inability: Not on file  . Transportation needs:    Medical: Not on file    Non-medical: Not on file  Tobacco Use  . Smoking status: Former Research scientist (life sciences)  . Smokeless tobacco: Never Used  Substance and Sexual Activity  . Alcohol use: Yes    Alcohol/week: 8.4 oz    Types: 14 Glasses of wine per week    Comment: daily  . Drug use: Never  . Sexual activity: Not on file  Lifestyle  . Physical activity:    Days per week: Not on file    Minutes per session: Not  on file  . Stress: Not on file  Relationships  . Social connections:    Talks on phone: Not on file    Gets together: Not on file    Attends religious service: Not on file    Active member of club or organization: Not on file    Attends meetings of clubs or organizations: Not on file    Relationship status: Not on file  . Intimate partner violence:    Fear of current or ex partner: Not on file    Emotionally abused: Not on file    Physically abused: Not on file  Forced sexual activity: Not on file  Other Topics Concern  . Not on file  Social History Narrative  . Not on file    Family History:    Family History  Problem Relation Age of Onset  . Cancer Mother   . Cancer Father   . Ulcerative colitis Father      ROS:  Please see the history of present illness.  All other ROS reviewed and negative.     Physical Exam/Data:   Vitals:   01/25/18 1815 01/25/18 2108 01/25/18 2352 01/26/18 0511  BP: 110/62 138/65 139/70 122/77  Pulse: (!) 102 (!) 105 (!) 105 (!) 114  Resp: _0 Temp: 100 F (37.8 C) 98.9 F (37.2 C) 98.1 F (36.7 C) 99.1 F (37.3 C)  TempSrc: Oral Oral Oral Oral  SpO2: 100% 93% 94% 93%  Weight:      Height:        Intake/Output Summary (Last 24 hours) at 01/26/2018 1232 Last data filed at 01/26/2018 0512 Gross per 24 hour  Intake 778.92 ml  Output 1450 ml  Net -671.08 ml   Filed Weights   01/25/18 1254  Weight: 203 lb 0.7 oz (92.1 kg)   Body mass index is 29.98 kg/m.  General:  Well nourished, well developed, in no acute distress, mildly ill-appearing HEENT: normal Lymph: no adenopathy Neck: no JVD Endocrine:  No thryomegaly Vascular: No carotid bruits Cardiac:  normal S1, S2; regular, mildly tachycardic; 2/6 systolic murmur right upper sternal border  Lungs:  clear to auscultation bilaterally, no wheezing, rhonchi or rales  Abd: soft, nontender, no hepatomegaly  Ext: no edema Musculoskeletal:  No deformities, BUE and BLE strength  normal and equal Skin: warm and dry, left lower extremity open wound dressed. Picture reviewed. [place] Neuro:  CNs 2-12 intact, no focal abnormalities noted Psych:  Normal affect   EKG:  The EKG was personally reviewed and demonstrates: 01/25/18 shows sinus tachycardia no other specific abnormalities  Telemetry:  Telemetry was personally reviewed and demonstrates: Sinus tachycardia  Relevant CV Studies:  ECHO 01/26/18:  - Procedure narrative: Transthoracic echocardiography. Image   quality was poor. The study was technically difficult, as a   result of poor sound wave transmission and poor patient   compliance. - Left ventricle: The cavity size was normal. Wall thickness was   increased in a pattern of moderate LVH. Systolic function was   normal. The estimated ejection fraction was in the range of 60%   to 65%. Wall motion was normal; there were no regional wall   motion abnormalities. Doppler parameters are consistent with   abnormal left ventricular relaxation (grade 1 diastolic   dysfunction). The E/e&' ratio is >15, suggesting elevated LV   filling pressure. - Aortic valve: Calcified leaflets. Mild stenosis. Mean gradient   (S): 12 mm Hg. Peak gradient (S): 21 mm Hg. Valve area (VTI):   1.83 cm^2. Valve area (Vmax): 1.61 cm^2. Valve area (Vmean): 1.71   cm^2. - Aorta: Aortic root dimension: 39 mm (ED). - Aortic root: The aortic root is dilated. - Mitral valve: Calcified annulus. Mildly thickened leaflets .   Mobile calcified material on the mitral valve annulus - may   represent partially calcified thrombus. Mild stenosis. Trivial   regurgitation. Valve area by continuity equation (using LVOT   flow): 1.8 cm^2. - Left atrium: Moderately dilated. - Inferior vena cava: The vessel was dilated. The respirophasic   diameter changes were blunted (< 50%), consistent with elevated  central venous pressure.  Impressions:  - Technically difficult study. LVEF 60-65%, moderate LVH,  normal   wall motion, grade 1 DD, elevated LV filling pressure, mild   aortic stenosis - AVA around 1.7 cm2, mean gradient of 12 mmHg,   MAC with mild mitral stenosis - calcified mobile mass on the   annulus, may be calcified thrombus, moderate LAE, dilated IVC.   Laboratory Data:  Chemistry Recent Labs  Lab 01/25/18 0312 01/25/18 2214 01/26/18 0508  NA 138 139 139  K 3.5 4.4 3.6  CL 101 105 102  CO2 _0 GLUCOSE 119* 199* 77  BUN 44* 24* 18  CREATININE 1.50* 1.07 0.95  CALCIUM 8.4* 8.3* 8.5*  GFRNONAA 48* >60 >60  GFRAA 56* >60 >60  ANIONGAP _1 No results for input(s): PROT, ALBUMIN, AST, ALT, ALKPHOS, BILITOT in the last 168 hours. Hematology Recent Labs  Lab 01/25/18 0312 01/25/18 2214 01/26/18 0508  WBC 9.1 8.0 9.8  RBC 1.65* 1.97* 2.38*  HGB 5.5* 6.5* 7.7*  HCT 18.1* 19.7* 23.8*  MCV 109.7* 100.0 100.0  MCH 33.3 33.0 32.4  MCHC 30.4 33.0 32.4  RDW 16.1* 21.1* 21.1*  PLT 300 226 238   Cardiac Enzymes Recent Labs  Lab 01/25/18 2214 01/26/18 0508  TROPONINI <0.03 0.03*   No results for input(s): TROPIPOC in the last 168 hours.  BNPNo results for input(s): BNP, PROBNP in the last 168 hours.  DDimer No results for input(s): DDIMER in the last 168 hours.  Radiology/Studies:  Dg Tibia/fibula Left  Result Date: 01/25/2018 CLINICAL DATA:  62 y/o M; nonhealing wound distal to the lateral aspect of the distal fibula. EXAM: LEFT TIBIA AND FIBULA - 2 VIEW COMPARISON:  None. FINDINGS: There is no evidence of fracture or other focal bone lesions. Soft tissue defect lateral to distal fibula. Extensive vascular calcifications. Partially visualized knee and ankle joints appear well maintained. IMPRESSION: Soft tissue defect lateral to the distal fibula. No findings of osteomyelitis. Electronically Signed   By: Kristine Garbe M.D.   On: 01/25/2018 03:06    Assessment and Plan:   Paroxysmal atrial fibrillation?, left atrial thrombus? -Rapid  ventricular response triggered by likely underlying infection.  However, I do not see any evidence of AFIB on telemetry. All sinus tachy with occasional PAC. Normal ejection fraction.  Heavily calcified mitral annular calcification noted on echocardiogram.  Thrombus seems less likely given the echodense nature and his recent use of Xarelto. Personally viewed echo.  -I do not see any rate controlling agents on his home medication list. -Now that his blood pressure is improved after resuscitation efforts, I would start him on diltiazem 30 mg p.o. every 6 hours only if atrial fibrillation were to return.  Currently his previous tachycardia was compensatory to his severe anemia.  Severe blood loss anemia -On Xarelto as well as Aleve, Hemoccult positive stool.  Hemoglobin 5.5 on admit.  Holding anticoagulation.  GI evaluation.  He does not report AFIB. He does not recall having DVT. He states that his vascular surgeon started the [place] 3 months ago. He recalls having angioplasty of lower extremity. Unable to verify via care everywhere. I would be hesitant to restart Xarelto without evidence of DVT.   Mildly elevated troponin -Demand ischemia in the setting of severe anemia.  Troponin flat, low 0.03.  Ejection fraction normal on echocardiogram. This does however portend a worsened prognosis.   Dyspnea -Ejection fraction normal.  This is likely secondary to  severe anemia.  No evidence of heart failure.  He may carry a diagnosis of diastolic heart failure given his prior atrial fibrillation especially given his Bumex prescription.  Mild aortic stenosis -Should be of little clinical concern at this point.  Continue to monitor as outpatient.  Please let us know if we can be of further assistance.    For questions or updates, please contact [place] Please consult [email or web address] for contact info under Cardiology/STEMI.   Signed, Candee Furbish, MD  01/26/2018 12:32 PM

## 2018-01-26 NOTE — Progress Notes (Signed)
Pharmacy Antibiotic Note  Luke SchwalbeRoy King is a 62 y.o. male admitted on 01/25/2018 with cellulitis related to pyoderma gangrenosum on [facility] several days ago.  Pharmacy has been consulted for Vancomycin and Ceftriaxone dosing.  Noted PCN allergy (anaphylaxis), but pt reports tolerating Keflex taken in January 2019.  [facility] [phone] and show that Keflex was filled 10/23/17.  SCr 0.95, CrCl ~ 90 ml/min WBC 9.8 Tm 100  Plan:  Ceftriaxone 2g IV q24h  Vancomycin 1500 mg IV once then 1750 mg IV q24h.  Measure Vanc peak, trough at steady state.  Goal AUC 400-500.  Follow up renal fxn, culture results, and clinical course.   Height: 5\' 9"  (175.3 cm) Weight: 203 lb 0.7 oz (92.1 kg) IBW/kg (Calculated) : 70.7  Temp (24hrs), Avg:98.7 F (37.1 C), Min:98 F (36.7 C), Max:100 F (37.8 C)  Recent Labs  Lab 01/25/18 0312 01/25/18 2214 01/26/18 0508  WBC 9.1 8.0 9.8  CREATININE 1.50* 1.07 0.95    Estimated Creatinine Clearance: 90.4 mL/min (by C-G formula based on SCr of 0.95 mg/dL).    Allergies  Allergen Reactions  . Contrast Media [Iodinated Diagnostic Agents] Anaphylaxis    Can't breathe/throat closes up  . Penicillins Anaphylaxis    Has patient had a PCN reaction causing immediate rash, facial/tongue/throat swelling, SOB or lightheadedness with hypotension: Yes Has patient had a PCN reaction causing severe rash involving mucus membranes or skin necrosis: No Has patient had a PCN reaction that required hospitalization: No Has patient had a PCN reaction occurring within the last 10 years: No If all of the above answers are "NO", then may proceed with Cephalosporin use. Pt reports "golf ball" in his throat around age 62.   . Red Dye Anaphylaxis    Antimicrobials this admission: 4/8 Clindamycin >> 4/9 4/9 Vancomycin >>  4/9 Ceftriaxone >>  Dose adjustments this admission:   Microbiology  results: 4/8 BCx: ngtd  Thank you for allowing pharmacy to be a part of this patient's care.  Lynann Beaverhristine Chalsey Leeth PharmD, BCPS Pager [phone] 01/26/2018 12:55 PM

## 2018-01-26 NOTE — Progress Notes (Signed)
PROGRESS NOTE [facility]   Mckinnley Mccormack   ZOX:[id number] DOB: 21-Jul-1956  DOA: 01/25/2018 PCP: System, Pcp Not In   Brief Narrative:  Luke King is a 62 year old male with medical history of ulcerative colitis, pyoderma gangrenosum on Remicade, diabetes mellitus, hypertension who presented to the emergency department complaining of left leg pain and weakness.  Patient has been seeing wound care for pyoderma gangrenosum and had a recent debridement about 4 days ago.  Upon ED evaluation patient was found to be afebrile, with sinus tachycardia, soft blood pressure.  Lab workup revealed creatinine of 1.5 which baseline is unknown, hemoglobin of 5.5, FOBT positive.  An x-ray of the lower extremity with no signs of osteomyelitis. Patient was admitted with working diagnosis of superimposed cellulitis on pyoderma gangrenosum lesion complicated with symptomatic anemia and acute renal failure.  Of note patient apparently developed A. fib with RVR, reviewed telemetry and EKGs no evidence of A. fib, patient was seen sinus tachycardia  Subjective: Patient seen and examined, he is complaining of severe lower extremity pain where lesion ulceration is.  No other complaints.  Patient wants to go home he is from [place].  Assessment & Plan: Symptomatic anemia Likely acute blood loss noted in setting of ulcerative colitis with anticoagulation and heavy NSAID use for leg pain. Patient was transfused 3 units of PRBCs, given patient history of UC and will monitor for signs of active bleeding.  Will continue PPI, and monitor for further bleeding.  If no further bleeding may follow-up with outpatient GI. Monitor CBC in a.m., advance diet as tolerated. Discontinue NSAIDs and hold anticoagulation.  Left lower extremity cellulitis in setting of pyoderma gangrenosum Patient was on clindamycin, will switch to vancomycin and Rocephin Wound care, with pyoderma gangrenosum trauma should be avoided within the lesions.   Continue daily dressing changes with saline, apply Xeroform and gauze covered with Kerlix.  Avoid wet-to-dry continue Santyl, tacrolimus and hydrocortisone.  Patient was on Xarelto apparently for pyoderma gangrenosum prescribed by ?  Vascular surgery but unclear real indication.  No history of DVT and Doppler is negative.  I would not recommend restarting anticoagulation.  Continue patient management as needed  Ulcerative colitis Patient on 6-MP, balsalazide, budesonide.  Patient follows with GI in [place].  At this point will hold on GI consult unless that he continues to be actively bleeding.  Continue to monitor.  ?  Paroxysmal atrial fibrillation with questionable left atrial thrombus I personally review telemetry and EKGs only identified sinus tachycardia.  Cardiology was consulted for ? left atrial thrombus identified on echocardiogram.  Recommendations appreciated.  Patient does not have history of A. fib.  Was on Xarelto for skin lesion.  Per cardiology thrombus seems to be less likely given echodense nature, most likely is calcification.  Recommended to start on Cardizem 30 mg p.o. only if atrial fibrillation returns.  For now we will continue to monitor and hold anticoagulation.  Hypertension/chronic diastolic CHF Patient presented with hypotension therefore BP medications and diuretic are on hold. Patient responded well to IV fluid resuscitation.  We will hold antihypertensive for now and will resume slowly in the next few days.  Diabetes mellitus type 2  Being managed with insulin pump. Patient removed insulin pump overnight, insulin  requirement calculated. Will start Levemir 50 units twice daily and insulin sliding scale every 4 hours. Monitor CBGs  AKI Baseline creatinine unknown Creatinine upon admission 1.5, now down to 0.95 after hydration Likely prerenal from hypotension and infectious process Request  PCP records Avoid nephrotoxic agent and hypotension Check BMP in  a.m.  Chronic alcohol use CIWA protocol  DVT prophylaxis: SCDs Code Status: Full code Family Communication: Wife via phone call Disposition Plan: Home in 2-3 days if infectious process under control and hemoglobin stable.  Consultants:   Cardiology  Procedures:   Echo  Antimicrobials: Anti-infectives (From admission, onward)   Start     Dose/Rate Route Frequency Ordered Stop   01/27/18 1000  vancomycin (VANCOCIN) 1,750 mg in sodium chloride 0.9 % 500 mL IVPB     1,750 mg 250 mL/hr over 120 Minutes Intravenous Every 24 hours 01/26/18 1457     01/26/18 1300  cefTRIAXone (ROCEPHIN) 2 g in sodium chloride 0.9 % 100 mL IVPB     2 g 200 mL/hr over 30 Minutes Intravenous Every 24 hours 01/26/18 1251     01/26/18 1045  vancomycin (VANCOCIN) 1,500 mg in sodium chloride 0.9 % 500 mL IVPB     1,500 mg 250 mL/hr over 120 Minutes Intravenous  Once 01/26/18 1042 01/26/18 1346   01/25/18 1400  clindamycin (CLEOCIN) IVPB 600 mg  Status:  Discontinued     600 mg 100 mL/hr over 30 Minutes Intravenous Every 8 hours 01/25/18 1249 01/26/18 0947       Objective: Vitals:   01/25/18 1815 01/25/18 2108 01/25/18 2352 01/26/18 0511  BP: 110/62 138/65 139/70 122/77  Pulse: (!) 102 (!) 105 (!) 105 (!) 114  Resp: 14 18  20   Temp: 100 F (37.8 C) 98.9 F (37.2 C) 98.1 F (36.7 C) 99.1 F (37.3 C)  TempSrc: Oral Oral Oral Oral  SpO2: 100% 93% 94% 93%  Weight:      Height:        Intake/Output Summary (Last 24 hours) at 01/26/2018 0947 Last data filed at 01/26/2018 0512 Gross per 24 hour  Intake 778.92 ml  Output 1450 ml  Net -671.08 ml   Filed Weights   01/25/18 1254  Weight: 92.1 kg (203 lb 0.7 oz)    Examination:  General exam: NAD HEENT: OP moist and clear Respiratory system: Clear to auscultation. No wheezes,crackle or rhonchi Cardiovascular system: S1 & S2 heard, RRR. No JVD, murmurs, rubs or gallops Gastrointestinal system: Abdomen is nondistended, soft and nontender.   Central nervous system: Alert and oriented. Extremities: Left LE edema, ulceration with purulent secretion   Skin: See pictures  Psychiatry: Mood & affect anxious.            Data Reviewed: I have personally reviewed following labs and imaging studies  CBC: Recent Labs  Lab 01/25/18 0312 01/25/18 2214 01/26/18 0508  WBC 9.1 8.0 9.8  NEUTROABS 5.5 6.0  --   HGB 5.5* 6.5* 7.7*  HCT 18.1* 19.7* 23.8*  MCV 109.7* 100.0 100.0  PLT 300 226 238   Basic Metabolic Panel: Recent Labs  Lab 01/25/18 0312 01/25/18 2214 01/26/18 0508  NA 138 139 139  K 3.5 4.4 3.6  CL 101 105 102  CO2 24 25 25   GLUCOSE 119* 199* 77  BUN 44* 24* 18  CREATININE 1.50* 1.07 0.95  CALCIUM 8.4* 8.3* 8.5*   GFR: Estimated Creatinine Clearance: 90.4 mL/min (by C-G formula based on SCr of 0.95 mg/dL). Liver Function Tests: No results for input(s): AST, ALT, ALKPHOS, BILITOT, PROT, ALBUMIN in the last 168 hours. No results for input(s): LIPASE, AMYLASE in the last 168 hours. No results for input(s): AMMONIA in the last 168 hours. Coagulation Profile: No results for input(s): INR, PROTIME  in the last 168 hours. Cardiac Enzymes: Recent Labs  Lab 01/25/18 2214 01/26/18 0508  TROPONINI <0.03 0.03*   BNP (last 3 results) No results for input(s): PROBNP in the last 8760 hours. HbA1C: Recent Labs    01/25/18 2214  HGBA1C 5.9*   CBG: Recent Labs  Lab 01/25/18 1627 01/25/18 2111 01/26/18 0158 01/26/18 0503 01/26/18 0744  GLUCAP 246* 189* 50* 71 80   Lipid Profile: No results for input(s): CHOL, HDL, LDLCALC, TRIG, CHOLHDL, LDLDIRECT in the last 72 hours. Thyroid Function Tests: Recent Labs    01/25/18 2214  TSH 4.913*   Anemia Panel: No results for input(s): VITAMINB12, FOLATE, FERRITIN, TIBC, IRON, RETICCTPCT in the last 72 hours. Sepsis Labs: No results for input(s): PROCALCITON, LATICACIDVEN in the last 168 hours.  No results found for this or any previous visit (from the past  240 hour(s)).    Radiology Studies: Dg Tibia/fibula Left  Result Date: 01/25/2018 CLINICAL DATA:  62 y/o M; nonhealing wound distal to the lateral aspect of the distal fibula. EXAM: LEFT TIBIA AND FIBULA - 2 VIEW COMPARISON:  None. FINDINGS: There is no evidence of fracture or other focal bone lesions. Soft tissue defect lateral to distal fibula. Extensive vascular calcifications. Partially visualized knee and ankle joints appear well maintained. IMPRESSION: Soft tissue defect lateral to the distal fibula. No findings of osteomyelitis. Electronically Signed   By: Mitzi Hansen M.D.   On: 01/25/2018 03:06      Scheduled Meds: . atorvastatin  10 mg Oral Daily  . balsalazide  2,250 mg Oral TID  . budesonide  9 mg Oral Daily  . collagenase  1 application Topical Daily  . folic acid  1 mg Oral Daily  . gabapentin  800 mg Oral QID  . hydrocortisone cream   Topical BID  . insulin aspart  0-9 Units Subcutaneous Q4H  . insulin pump   Subcutaneous TID AC, HS, 0200  . LORazepam  0-4 mg Oral Q6H   Followed by  . [START ON 01/27/2018] LORazepam  0-4 mg Oral Q12H  . mercaptopurine  75 mg Oral Daily  .  morphine injection  2 mg Intravenous Once  . multivitamin with minerals  1 tablet Oral Daily  . pantoprazole  40 mg Oral BID AC  . sodium chloride flush  3 mL Intravenous Q12H  . tacrolimus  1 application Topical BID  . thiamine  100 mg Oral Daily   Or  . thiamine  100 mg Intravenous Daily   Continuous Infusions: . clindamycin (CLEOCIN) IV Stopped (01/26/18 0654)  . insulin pump    . sodium chloride       LOS: 1 day    Time spent: Total of 35 minutes spent with pt, greater than 50% of which was spent in discussion of  treatment, counseling and coordination of care   Latrelle Dodrill, MD Pager: Text Page via [email or web address]   If 7PM-7AM, please contact night-coverage [email or web address] 01/26/2018, 9:47 AM   Note - This record has been created using [employer]. Chart creation  errors have been sought, but may not always have been located. Such creation errors do not reflect on the standard of medical care.

## 2018-01-26 NOTE — Progress Notes (Signed)
Inpatient Diabetes Program Recommendations  AACE/ADA: New Consensus Statement on Inpatient Glycemic Control (2015)  Target Ranges:  Prepandial:   less than 140 mg/dL      Peak postprandial:   less than 180 mg/dL (1-2 hours)      Critically ill patients:  140 - 180 mg/dL   Lab Results  Component Value Date   GLUCAP 128 (H) 01/26/2018   HGBA1C 5.9 (H) 01/25/2018    Review of Glycemic ControlResults for Karie SchwalbeRAEGER, Kainan (MRN [id number]) as of 01/26/2018 14:34  Ref. Range 01/25/2018 21:11 01/26/2018 01:58 01/26/2018 05:03 01/26/2018 07:44 01/26/2018 11:46  Glucose-Capillary Latest Ref Range: 65 - 99 mg/dL 782189 (H) 50 (L) 71 80 956128 (H)   Diabetes history: DM2 Outpatient Diabetes medications: Insulin pump (see below for rates) Settings: Basal -  12MN - 8AM - 3 units/hour               8AM - 12 MN - 5 units/hour Total basal=104 units/24 hours Bolus - 5 units at B, L, D Current orders for Inpatient glycemic control:  Levemir 50 units bid, Novolog sensitive q 4 hours  Inpatient Diabetes Program Recommendations:   Spoke with RN.  She states that patient decided to take insulin pump off overnight due to blood sugars being low.  Note that Levemir/ Novolog started by MD.  Will follow.   Thanks,  Beryl MeagerJenny Pascal Stiggers, RN, BC-ADM Inpatient Diabetes Coordinator Pager [phone] (8a-5p)

## 2018-01-27 LAB — CBC WITH DIFFERENTIAL/PLATELET
BASOS ABS: 0 10*3/uL (ref 0.0–0.1)
Basophils Relative: 0 %
EOS PCT: 1 %
Eosinophils Absolute: 0.1 10*3/uL (ref 0.0–0.7)
HEMATOCRIT: 26.1 % — AB (ref 39.0–52.0)
Hemoglobin: 8.3 g/dL — ABNORMAL LOW (ref 13.0–17.0)
LYMPHS ABS: 2 10*3/uL (ref 0.7–4.0)
Lymphocytes Relative: 23 %
MCH: 31.8 pg (ref 26.0–34.0)
MCHC: 31.8 g/dL (ref 30.0–36.0)
MCV: 100 fL (ref 78.0–100.0)
Monocytes Absolute: 1.4 10*3/uL — ABNORMAL HIGH (ref 0.1–1.0)
Monocytes Relative: 16 %
NEUTROS ABS: 5.1 10*3/uL (ref 1.7–7.7)
NEUTROS PCT: 60 %
Platelets: 226 10*3/uL (ref 150–400)
RBC: 2.61 MIL/uL — ABNORMAL LOW (ref 4.22–5.81)
RDW: 20.7 % — AB (ref 11.5–15.5)
WBC: 8.6 10*3/uL (ref 4.0–10.5)
nRBC: 2 /100 WBC — ABNORMAL HIGH

## 2018-01-27 LAB — TYPE AND SCREEN
ABO/RH(D): O POS
Antibody Screen: NEGATIVE
Unit division: 0
Unit division: 0
Unit division: 0

## 2018-01-27 LAB — BPAM RBC
BLOOD PRODUCT EXPIRATION DATE: 201905012359
BLOOD PRODUCT EXPIRATION DATE: 201905012359
BLOOD PRODUCT EXPIRATION DATE: 201905052359
ISSUE DATE / TIME: 201904081756
ISSUE DATE / TIME: 201904091750
ISSUE DATE / TIME: 201904081310
UNIT TYPE AND RH: 5100
UNIT TYPE AND RH: 5100
UNIT TYPE AND RH: 5100

## 2018-01-27 LAB — GLUCOSE, CAPILLARY
GLUCOSE-CAPILLARY: 104 mg/dL — AB (ref 65–99)
GLUCOSE-CAPILLARY: 105 mg/dL — AB (ref 65–99)
GLUCOSE-CAPILLARY: 173 mg/dL — AB (ref 65–99)
GLUCOSE-CAPILLARY: 57 mg/dL — AB (ref 65–99)
Glucose-Capillary: 106 mg/dL — ABNORMAL HIGH (ref 65–99)
Glucose-Capillary: 136 mg/dL — ABNORMAL HIGH (ref 65–99)
Glucose-Capillary: 254 mg/dL — ABNORMAL HIGH (ref 65–99)
Glucose-Capillary: 40 mg/dL — CL (ref 65–99)
Glucose-Capillary: 40 mg/dL — CL (ref 65–99)
Glucose-Capillary: 43 mg/dL — CL (ref 65–99)
Glucose-Capillary: 80 mg/dL (ref 65–99)

## 2018-01-27 LAB — BASIC METABOLIC PANEL
ANION GAP: 10 (ref 5–15)
BUN: 11 mg/dL (ref 6–20)
CALCIUM: 8.2 mg/dL — AB (ref 8.9–10.3)
CO2: 25 mmol/L (ref 22–32)
Chloride: 103 mmol/L (ref 101–111)
Creatinine, Ser: 0.88 mg/dL (ref 0.61–1.24)
GFR calc Af Amer: >60 mL/min (ref >60)
GFR calc non Af Amer: >60 mL/min (ref >60)
GLUCOSE: 75 mg/dL (ref 65–99)
Potassium: 3.9 mmol/L (ref 3.5–5.1)
Sodium: 138 mmol/L (ref 135–145)

## 2018-01-27 LAB — MAGNESIUM: Magnesium: 1.9 mg/dL (ref 1.7–2.4)

## 2018-01-27 MED ORDER — DEXTROSE 50 % IV SOLN
INTRAVENOUS | Status: AC
  Administered 2018-01-27: 09:00:00
  Filled 2018-01-27: qty 50

## 2018-01-27 MED ORDER — DEXTROSE-NACL 5-0.45 % IV SOLN
INTRAVENOUS | Status: DC
  Administered 2018-01-27: 08:00:00 via INTRAVENOUS

## 2018-01-27 MED ORDER — INSULIN DETEMIR 100 UNIT/ML ~~LOC~~ SOLN
35.0000 [IU] | Freq: Every day | SUBCUTANEOUS | Status: DC
  Administered 2018-01-27: 35 [IU] via SUBCUTANEOUS
  Filled 2018-01-27: qty 0.35

## 2018-01-27 MED ORDER — DEXTROSE 50 % IV SOLN: 25.0000 mL | Freq: Once | INTRAVENOUS | Status: AC

## 2018-01-27 MED ORDER — INSULIN ASPART 100 UNIT/ML ~~LOC~~ SOLN
0.0000 [IU] | Freq: Three times a day (TID) | SUBCUTANEOUS | Status: DC
  Administered 2018-01-27: 2 [IU] via SUBCUTANEOUS

## 2018-01-27 MED ORDER — OXYCODONE HCL 5 MG PO TABS
5.0000 mg | ORAL_TABLET | ORAL | Status: DC | PRN
Start: 2018-01-27 — End: 2018-01-28
  Administered 2018-01-27 – 2018-01-28 (×5): 10 mg via ORAL
  Filled 2018-01-27 (×5): qty 2

## 2018-01-27 MED ORDER — OXYCODONE HCL 5 MG PO TABS: 5.0000 mg | ORAL_TABLET | ORAL | Status: DC | PRN

## 2018-01-27 MED ORDER — ALPRAZOLAM 0.5 MG PO TABS
0.5000 mg | ORAL_TABLET | Freq: Four times a day (QID) | ORAL | Status: DC | PRN
  Administered 2018-01-27 – 2018-01-28 (×3): 0.5 mg via ORAL
  Filled 2018-01-27 (×3): qty 1

## 2018-01-27 MED ORDER — JUVEN PO PACK
1.0000 | PACK | Freq: Two times a day (BID) | ORAL | Status: DC
  Administered 2018-01-27 – 2018-01-28 (×3): 1 via ORAL
  Filled 2018-01-27 (×4): qty 1

## 2018-01-27 NOTE — Progress Notes (Signed)
Hypoglycemic Event  CBG: 40  Treatment: 15 GM carbohydrate snack  Symptoms: Sweaty, shaky, irritable  Follow-up CBG: Time:0810 CBG Result:40  Possible Reasons for Event: Unknown  Comments/MD notified: Dr. Forde Dandyhoi    Deandrew Hoecker, Yancey Flemingsana Johnson

## 2018-01-27 NOTE — Progress Notes (Signed)
PROGRESS NOTE    Luke King  ZOX:[id number] DOB: June 06, 1956 DOA: 01/25/2018 PCP: System, Pcp Not In     Brief Narrative:  Luke King is a 62 year old male with medical history of ulcerative colitis, pyoderma gangrenosum on Remicade, diabetes mellitus, hypertension who presented to the emergency department complaining of left leg pain and weakness.  Patient has been seeing wound care for pyoderma gangrenosum and had a recent debridement about 4 days ago.  Upon ED evaluation patient was found to be afebrile, with sinus tachycardia, soft blood pressure.  Lab workup revealed creatinine of 1.5 which baseline is unknown, hemoglobin of 5.5, FOBT positive.  An x-ray of the lower extremity with no signs of osteomyelitis. Patient was admitted with working diagnosis of superimposed cellulitis on pyoderma gangrenosum lesion complicated with symptomatic anemia and acute renal failure.  Assessment & Plan:   Active Problems:   Symptomatic anemia   Atrial fibrillation with RVR (HCC)   Gastrointestinal hemorrhage   Symptomatic anemia Likely acute blood loss noted in setting of ulcerative colitis with anticoagulation and heavy NSAID use (Aleve 2 tabs every 4 hours) for leg pain. Patient was transfused 3 units of PRBCs, given patient history of UC and heavy NSAID use and Xarelto, he was monitored. No further bleeding noted. He will need to follow-up with outpatient GI. Discontinue NSAIDs and hold anticoagulation.  Left lower extremity cellulitis in setting of pyoderma gangrenosum Patient was on clindamycin, will switch to vancomycin and Rocephin Wound care, with pyoderma gangrenosum trauma should be avoided within the lesions.  Continue daily dressing changes with saline, apply Xeroform and gauze covered with Kerlix.  Avoid wet-to-dry continue Santyl, tacrolimus and hydrocortisone.  Patient was on Xarelto apparently for pyoderma gangrenosum prescribed by ?vascular surgery but unclear real indication.  No  history of DVT and Doppler is negative. Blood cultures pending.   Ulcerative colitis Patient on 6-MP, balsalazide, budesonide.  Patient follows with GI in [place].  At this point will hold on GI consult unless that he continues to be actively bleeding.  Continue to monitor. Hgb stable.   ? Paroxysmal atrial fibrillation with questionable left atrial thrombus Cardiology was consulted for ? left atrial thrombus identified on echocardiogram.  Recommendations appreciated.  Patient does not have history of A. fib.  Was on Xarelto for skin lesion.  Per cardiology thrombus seems to be less likely given echodense nature, most likely is calcification.  Recommended to start on Cardizem 30 mg p.o. only if atrial fibrillation returns.  For now we will continue to monitor and hold anticoagulation.  Hypertension/chronic diastolic CHF Patient presented with hypotension therefore BP medications and diuretic are on hold. BP stable   Diabetes mellitus type 2  Being managed with insulin pump. Patient removed insulin pump and was started on levemir, SSI. Now with hypoglycemia. Advance diet and decrease insulin dosing per recommendation from DM coordinator. D5-1/2NS started.   AKI Baseline creatinine unknown Creatinine upon admission 1.5, now down to 0.88 after hydration Likely prerenal from hypotension and infectious process Resolved   Chronic alcohol use CIWA protocol  Anxiety Xanax prn   HLD Continue lipitor     DVT prophylaxis: SCD Code Status: Full Family Communication: No family at bedside Disposition Plan: Pending stabilization in blood sugar levels, hopefully discharge home on 4/11   Consultants:   None  Procedures:   None   Antimicrobials:  Anti-infectives (From admission, onward)   Start     Dose/Rate Route Frequency Ordered Stop   01/27/18 1000  vancomycin (VANCOCIN)  1,750 mg in sodium chloride 0.9 % 500 mL IVPB     1,750 mg 250 mL/hr over 120 Minutes Intravenous Every  24 hours 01/26/18 1457     01/26/18 1300  cefTRIAXone (ROCEPHIN) 2 g in sodium chloride 0.9 % 100 mL IVPB     2 g 200 mL/hr over 30 Minutes Intravenous Every 24 hours 01/26/18 1251     01/26/18 1045  vancomycin (VANCOCIN) 1,500 mg in sodium chloride 0.9 % 500 mL IVPB     1,500 mg 250 mL/hr over 120 Minutes Intravenous  Once 01/26/18 1042 01/26/18 1346   01/25/18 1400  clindamycin (CLEOCIN) IVPB 600 mg  Status:  Discontinued     600 mg 100 mL/hr over 30 Minutes Intravenous Every 8 hours 01/25/18 1249 01/26/18 0947       Subjective: Had low blood sugar this morning.  He states that it is due to the fact that his insulin regimen was switched and he has been on clear liquid diet.  No complaints of chest pain or abdominal pain.  Continues to have left lower leg pain at the site of pyoderma.  Objective: Vitals:   01/26/18 1810 01/26/18 2051 01/27/18 0019 01/27/18 0414  BP: 121/75 129/72 127/80 121/77  Pulse: (!) 102 91 93 96  Resp: 18 20 20 12   Temp: 98.3 F (36.8 C) 98.8 F (37.1 C) 98 F (36.7 C) 98.4 F (36.9 C)  TempSrc: Oral Oral Oral Oral  SpO2: 92% 92% 95% 94%  Weight:    94.5 kg (208 lb 5.4 oz)  Height:        Intake/Output Summary (Last 24 hours) at 01/27/2018 1415 Last data filed at 01/27/2018 1126 Gross per 24 hour  Intake 2314.17 ml  Output -  Net 2314.17 ml   Filed Weights   01/25/18 1254 01/27/18 0414  Weight: 92.1 kg (203 lb 0.7 oz) 94.5 kg (208 lb 5.4 oz)    Examination:  General exam: Appears calm and comfortable  Respiratory system: Clear to auscultation. Respiratory effort normal. Cardiovascular system: S1 & S2 heard, RRR. No JVD, murmurs, rubs, gallops or clicks. No pedal edema. Gastrointestinal system: Abdomen is nondistended, soft and nontender. No organomegaly or masses felt. Normal bowel sounds heard. Central nervous system: Alert and oriented. No focal neurological deficits. Extremities: Symmetric 5 x 5 power. Skin: +LLL wrapped with clean and dry  dressing  Psychiatry: Judgement and insight appear normal. Mood & affect appropriate.   Data Reviewed: I have personally reviewed following labs and imaging studies  CBC: Recent Labs  Lab 01/25/18 0312 01/25/18 2214 01/26/18 0508 01/27/18 0555  WBC 9.1 8.0 9.8 8.6  NEUTROABS 5.5 6.0  --  5.1  HGB 5.5* 6.5* 7.7* 8.3*  HCT 18.1* 19.7* 23.8* 26.1*  MCV 109.7* 100.0 100.0 100.0  PLT 300 226 238 226   Basic Metabolic Panel: Recent Labs  Lab 01/25/18 0312 01/25/18 2214 01/26/18 0508 01/27/18 0555  NA 138 139 139 138  K 3.5 4.4 3.6 3.9  CL 101 105 102 103  CO2 24 25 25 25   GLUCOSE 119* 199* 77 75  BUN 44* 24* 18 11  CREATININE 1.50* 1.07 0.95 0.88  CALCIUM 8.4* 8.3* 8.5* 8.2*  MG  --   --   --  1.9   GFR: Estimated Creatinine Clearance: 98.7 mL/min (by C-G formula based on SCr of 0.88 mg/dL). Liver Function Tests: No results for input(s): AST, ALT, ALKPHOS, BILITOT, PROT, ALBUMIN in the last 168 hours. No results for input(s): LIPASE,  AMYLASE in the last 168 hours. No results for input(s): AMMONIA in the last 168 hours. Coagulation Profile: No results for input(s): INR, PROTIME in the last 168 hours. Cardiac Enzymes: Recent Labs  Lab 01/25/18 2214 01/26/18 0508 01/26/18 1132  TROPONINI <0.03 0.03* <0.03   BNP (last 3 results) No results for input(s): PROBNP in the last 8760 hours. HbA1C: Recent Labs    01/25/18 2214  HGBA1C 5.9*   CBG: Recent Labs  Lab 01/27/18 0757 01/27/18 0813 01/27/18 0830 01/27/18 0950 01/27/18 1158  GLUCAP 40* 40* 104* 106* 136*   Lipid Profile: No results for input(s): CHOL, HDL, LDLCALC, TRIG, CHOLHDL, LDLDIRECT in the last 72 hours. Thyroid Function Tests: Recent Labs    01/25/18 2214  TSH 4.913*   Anemia Panel: No results for input(s): VITAMINB12, FOLATE, FERRITIN, TIBC, IRON, RETICCTPCT in the last 72 hours. Sepsis Labs: No results for input(s): PROCALCITON, LATICACIDVEN in the last 168 hours.  Recent Results  (from the past 240 hour(s))  Blood Cultures x 2 sites     Status: None (Preliminary result)   Collection Time: 01/25/18  9:58 PM  Result Value Ref Range Status   Specimen Description   Final    BLOOD RIGHT ANTECUBITAL Performed at [facility], [address], [place], [place] [address]    Special Requests   Final    BOTTLES DRAWN AEROBIC AND ANAEROBIC Blood Culture adequate volume Performed at [facility], [address] [place], [place] [address]    Culture   Final    NO GROWTH < 12 HOURS Performed at [facility], [address] [place], [place] [address]    Report Status PENDING  Incomplete  Blood Cultures x 2 sites     Status: None (Preliminary result)   Collection Time: 01/25/18 10:04 PM  Result Value Ref Range Status   Specimen Description   Final    BLOOD LEFT ANTECUBITAL Performed at [facility], [address] [place], [place] [address]    Special Requests   Final    BOTTLES DRAWN AEROBIC AND ANAEROBIC Blood Culture adequate volume Performed at [facility], [address], [place], [place] [address]    Culture   Final    NO GROWTH < 12 HOURS Performed at [facility], [address] [place], [place] [address]    Report Status PENDING  Incomplete       Radiology Studies: No results found.    Scheduled Meds: . atorvastatin  10 mg Oral Daily  . balsalazide  2,250 mg Oral TID  . budesonide  9 mg Oral Daily  . collagenase  1 application Topical Daily  . folic acid  1 mg Oral Daily  . gabapentin  800 mg Oral QID  . hydrocortisone cream   Topical BID  . insulin aspart  0-9 Units Subcutaneous TID WC  . insulin detemir  35 Units Subcutaneous QHS  . mercaptopurine  75 mg Oral Daily  . multivitamin with minerals  1 tablet Oral Daily  . nutrition supplement (JUVEN)  1 packet Oral BID BM  . pantoprazole  40 mg Oral BID AC  . sodium chloride flush  3 mL Intravenous Q12H    . tacrolimus  1 application Topical BID  . thiamine  100 mg Oral Daily   Or  . thiamine  100 mg Intravenous Daily   Continuous Infusions: . cefTRIAXone (ROCEPHIN)  IV Stopped (01/26/18 1514)  . dextrose 5 %  and 0.45% NaCl 50 mL/hr at 01/27/18 0825  . sodium chloride    . vancomycin 1,750 mg (01/27/18 1126)     LOS: 2 days    Time spent: 35  minutes   Noralee Stain, DO [facility] [email or web address] Password [email or web address] 01/27/2018, 2:15 PM

## 2018-01-27 NOTE — Progress Notes (Signed)
CBG 43 at 0016. Given graham crackers, peanut butter, and a cup of orange juice. CBG 57 15 minutes after intervention. Given another cup of orange juice, graham crackers, and peanut butter. CBG 105 after 15 minutes.

## 2018-01-27 NOTE — Progress Notes (Addendum)
Inpatient Diabetes Program Recommendations  AACE/ADA: New Consensus Statement on Inpatient Glycemic Control (2015)  Target Ranges:  Prepandial:   less than 140 mg/dL      Peak postprandial:   less than 180 mg/dL (1-2 hours)      Critically ill patients:  140 - 180 mg/dL   Results for Luke King, Luke King (MRN [id number]) as of 01/27/2018 08:54  Ref. Range 01/26/2018 01:58 01/26/2018 05:03 01/26/2018 07:44 01/26/2018 11:46 01/26/2018 16:55 01/26/2018 20:36  Glucose-Capillary Latest Ref Range: 65 - 99 mg/dL 50 (L) 71 80 409128 (H)  1 unit NOVOLOG +  50 units LEVEMIR  146 (H)  1 unit NOVOLOG  144 (H)  1 unit NOVOLOG +  50 units LEVEMIR at 10PM    Results for Luke King, Luke King (MRN [id number]) as of 01/27/2018 08:54  Ref. Range 01/27/2018 00:16 01/27/2018 00:46 01/27/2018 01:15 01/27/2018 07:57 01/27/2018 08:13 01/27/2018 08:30  Glucose-Capillary Latest Ref Range: 65 - 99 mg/dL 43 (LL) 57 (L) 956105 (H) 40 (LL) 40 (LL) 104 (H)    Home DM Meds: Insulin Pump       Basal -  12MN - 8AM - 3 units/hour                             8AM - 12 MN - 5 units/hour          Total basal=104 units/24 hours          Bolus - 5 units at B, L, D    Current Insulin Orders: Levemir 50 units BID       Novolog Sensitive Correction Scale/ SSI (0-9 units) Q4 hours       MD- Note patient with Multiple episodes of Hypoglycemia.  Insulin Pump was removed by patient during the night prior to 04/09.  Patient received a total of 100 units Levemir yesterday.  Please consider the following:  1. Decrease Levemir to 35 units BID (30% total reduction of dose).  Would go ahead and start reduced dose this AM.  2. Change Novolog SSI coverage to TID AC + HS (currently ordered Q4 hours)      --Will follow patient during hospitalization--  Ambrose FinlandJeannine Johnston Karlis Cregg RN, MSN, CDE Diabetes Coordinator Inpatient Glycemic Control Team Team Pager: [phone] (8a-5p)

## 2018-01-27 NOTE — Progress Notes (Signed)
Initial Nutrition Assessment  INTERVENTION:   Provide Juven Fruit Punch BID, each serving provides 80kcal and 14g of protein (amino acids glutamine and arginine)  NUTRITION DIAGNOSIS:   Increased nutrient needs related to wound healing as evidenced by estimated needs.  GOAL:   Patient will meet greater than or equal to 90% of their needs  MONITOR:   PO intake, Supplement acceptance, Labs, Weight trends, I & O's, Skin  REASON FOR ASSESSMENT:   Consult Wound healing  ASSESSMENT:   62 year old male with medical history of ulcerative colitis, pyoderma gangrenosum on Remicade, diabetes mellitus, hypertension who presented to the emergency department complaining of left leg pain and weakness.  Pt reports feeling hungry. He had some hypoglycemic events today. States this was because he was on a liquid diet. Diet was just advanced prior to RD visit, now on CHO modified diet. Explained diet to patient and went over menu options. Pt appreciative. Reviewed carbohydrate foods with patient. Encouraged a protein food with every meal and snack.  RD ordered Juven supplements to aid in wound healing.   Pt reports UBW of 205 lb. Pt with no weight loss.  Medications: Folic acid tablet daily, Multivitamin with minerals daily, Juven packet BID, Thiamine tablet daily, D5 -.45% NaCl infusion at 50 ml/hr Labs reviewed: CBGs: 106-136   NUTRITION - FOCUSED PHYSICAL EXAM:  Nutrition focused physical exam shows no sign of depletion of muscle mass or body fat.  Diet Order:  Diet Carb Modified Fluid consistency: Thin; Room service appropriate? Yes  EDUCATION NEEDS:   Education needs have been addressed  Skin:  Skin Assessment: Skin Integrity Issues: Skin Integrity Issues:: Stage II, Stage III, Diabetic Ulcer Stage II: right leg Stage III: left leg  Last BM:  4/9  Height:   Ht Readings from Last 1 Encounters:  01/25/18 5\' 9"  (1.753 m)    Weight:   Wt Readings from Last 1 Encounters:   01/27/18 208 lb 5.4 oz (94.5 kg)    Ideal Body Weight:  72.7 kg  BMI:  Body mass index is 30.77 kg/m.  Estimated Nutritional Needs:   Kcal:  2200-2400  Protein:  105-115g  Fluid:  2.2L/day  Tilda FrancoLindsey Tiffanie Blassingame, MS, RD, LDN [facility] Clinical Dietitian Pager: [phone] After Hours Pager: [phone]

## 2018-01-27 NOTE — Progress Notes (Signed)
Hypoglycemic Event  CBG: 40 (after 15 gm carb snack  Treatment: D50 IV 25 mL  Symptoms: Pale, sweaty, shaky, irritable  Follow-up CBG: Time:0830 CBG Result:104  Possible Reasons for Event: Unknown  Comments/MD notified: Dr. Forde Dandyhoi    Luke King, Luke King

## 2018-01-28 DIAGNOSIS — E119 Type 2 diabetes mellitus without complications: Secondary | ICD-10-CM

## 2018-01-28 DIAGNOSIS — D649 Anemia, unspecified: Secondary | ICD-10-CM

## 2018-01-28 DIAGNOSIS — K922 Gastrointestinal hemorrhage, unspecified: Secondary | ICD-10-CM

## 2018-01-28 DIAGNOSIS — L88 Pyoderma gangrenosum: Secondary | ICD-10-CM

## 2018-01-28 DIAGNOSIS — K5190 Ulcerative colitis, unspecified, without complications: Secondary | ICD-10-CM

## 2018-01-28 LAB — CBC
HCT: 26.9 % — ABNORMAL LOW (ref 39.0–52.0)
Hemoglobin: 8.5 g/dL — ABNORMAL LOW (ref 13.0–17.0)
MCH: 31.8 pg (ref 26.0–34.0)
MCHC: 31.6 g/dL (ref 30.0–36.0)
MCV: 100.7 fL — AB (ref 78.0–100.0)
Platelets: 236 10*3/uL (ref 150–400)
RBC: 2.67 MIL/uL — ABNORMAL LOW (ref 4.22–5.81)
RDW: 19.9 % — AB (ref 11.5–15.5)
WBC: 8.2 10*3/uL (ref 4.0–10.5)

## 2018-01-28 LAB — BASIC METABOLIC PANEL
ANION GAP: 12 (ref 5–15)
BUN: 11 mg/dL (ref 6–20)
CO2: 24 mmol/L (ref 22–32)
Calcium: 8.2 mg/dL — ABNORMAL LOW (ref 8.9–10.3)
Chloride: 101 mmol/L (ref 101–111)
Creatinine, Ser: 0.69 mg/dL (ref 0.61–1.24)
GFR calc Af Amer: >60 mL/min (ref >60)
GLUCOSE: 80 mg/dL (ref 65–99)
Potassium: 3.6 mmol/L (ref 3.5–5.1)
SODIUM: 137 mmol/L (ref 135–145)

## 2018-01-28 LAB — GLUCOSE, CAPILLARY
GLUCOSE-CAPILLARY: 88 mg/dL (ref 65–99)
Glucose-Capillary: 113 mg/dL — ABNORMAL HIGH (ref 65–99)
Glucose-Capillary: 83 mg/dL (ref 65–99)
Glucose-Capillary: 90 mg/dL (ref 65–99)

## 2018-01-28 MED ORDER — INSULIN PUMP
SUBCUTANEOUS | Status: DC
  Administered 2018-01-28: 12:00:00 via SUBCUTANEOUS
  Filled 2018-01-28: qty 1

## 2018-01-28 MED ORDER — INSULIN PUMP: 1.0000 | SUBCUTANEOUS | Status: AC

## 2018-01-28 MED ORDER — DOXYCYCLINE HYCLATE 100 MG PO TABS: 100.0000 mg | ORAL_TABLET | Freq: Two times a day (BID) | ORAL | 0 refills | Status: AC

## 2018-01-28 MED ORDER — PANTOPRAZOLE SODIUM 40 MG PO TBEC: 40.0000 mg | DELAYED_RELEASE_TABLET | Freq: Two times a day (BID) | ORAL | 0 refills | Status: AC

## 2018-01-28 NOTE — Progress Notes (Signed)
Results for Luke King, Luke King (MRN [id number]) as of 01/28/2018 12:40  Ref. Range 01/25/2018 02:27 01/25/2018 11:00 01/25/2018 16:27 01/25/2018 21:11  Glucose-Capillary Latest Ref Range: 65 - 99 mg/dL 98 409231 (H) 811246 (H) 914189 (H)    Results for Luke King, Luke King (MRN [id number]) as of 01/28/2018 12:40  Ref. Range 01/26/2018 01:58 01/26/2018 05:03 01/26/2018 07:44 01/26/2018 11:46 01/26/2018 16:55 01/26/2018 20:36  Glucose-Capillary Latest Ref Range: 65 - 99 mg/dL 50 (L) 71 80 086128 (H) 578146 (H) 144 (H)   Results for Luke King, Luke King (MRN [id number]) as of 01/28/2018 12:40  Ref. Range 01/26/2018 01:58 01/26/2018 05:03 01/26/2018 07:44 01/26/2018 11:46 01/26/2018 16:55 01/26/2018 20:36  Glucose-Capillary Latest Ref Range: 65 - 99 mg/dL 50 (L) 71 80 413128 (H) 244146 (H) 144 (H)    Results for Luke King, Luke King (MRN [id number]) as of 01/28/2018 12:40  Ref. Range 01/28/2018 01:50 01/28/2018 06:14 01/28/2018 07:26 01/28/2018 12:05  Glucose-Capillary Latest Ref Range: 65 - 99 mg/dL 644113 (H) 83 88 90     Patient requested to have a log of his CBGs to take home with him so he can share these CBGs with his Endocrinologist in [place].  All CBGs that have been taken since admission entered in to note for pt to take home with him.  Patient for d/c home today.    --Will follow patient during hospitalization--  Ambrose FinlandJeannine Johnston Shyann Hefner RN, MSN, CDE Diabetes Coordinator Inpatient Glycemic Control Team Team Pager: [phone] (8a-5p)

## 2018-01-28 NOTE — Progress Notes (Signed)
Inpatient Diabetes Program Recommendations  AACE/ADA: New Consensus Statement on Inpatient Glycemic Control (2015)  Target Ranges:  Prepandial:   less than 140 mg/dL      Peak postprandial:   less than 180 mg/dL (1-2 hours)      Critically ill patients:  140 - 180 mg/dL   Results for LE, FAULCON (MRN [id number]) as of 01/27/2018 08:54  Ref. Range 01/26/2018 01:58 01/26/2018 05:03 01/26/2018 07:44 01/26/2018 11:46 01/26/2018 16:55 01/26/2018 20:36  Glucose-Capillary Latest Ref Range: 65 - 99 mg/dL 50 (L) 71 80 782 (H)  1 unit NOVOLOG +  50 units LEVEMIR  146 (H)  1 unit NOVOLOG  144 (H)  1 unit NOVOLOG +  50 units LEVEMIR at 10PM    Results for DAMAREA, MERKEL (MRN [id number]) as of 01/28/2018 10:32  Ref. Range 01/27/2018 00:16 01/27/2018 00:46 01/27/2018 01:15 01/27/2018 04:12 01/27/2018 07:57 01/27/2018 08:13 01/27/2018 08:30 01/27/2018 09:50 01/27/2018 11:58 01/27/2018 17:33 01/27/2018 20:12  Glucose-Capillary Latest Ref Range: 65 - 99 mg/dL 43 (LL) 57 (L) 578 (H) 80 40 (LL) 40 (LL) 104 (H) 106 (H) 136 (H)  1 unit NOVOLOG  173 (H)  2 units NOVOLOG  254 (H)  35 units LEVEMIR    Results for TALTON, DELPRIORE (MRN [id number]) as of 01/28/2018 10:32  Ref. Range 01/28/2018 01:50 01/28/2018 06:14 01/28/2018 07:26  Glucose-Capillary Latest Ref Range: 65 - 99 mg/dL 413 (H) 83 88    Home DM Meds: Insulin Pump                             Basal - - 8AM - 3 units/hour                             8AM - 12 MN - 5 units/hour                                           Total basal=104 units/24 hours                                           Bolus - 5 units at B, L, D    Current Insulin Orders: Levemir 35 units QHS                                       Novolog Sensitive Correction Scale/ SSI (0-9 units) Q4 hours       Spoke with Dr. Jerral Ralph by phone today.  Dr. Jerral Ralph stated pt wants to go home today and does not want to take insulin injections.  Patient wants to resume his insulin  pump.  Discussed with Dr. Jerral Ralph that pt usually gets 104 units insulin as a basal rate on his pump for a 24 hour period.  Was having severe Hypoglycemia so pt took insulin pump off.  Basal rate was replaced with Levemir 50 units BID.  Patient continued to have Hypoglycemia with the Levemir at 50 units BID, so Levemir was reduced to 35 units QHS.  Patient only received 35 units Levemir last PM and CBG this AM was 88 mg/dl.  Dr. Jerral Ralph and  I discussed having pt put his pump back on but reducing his basal rates on his pump by 50% until he can see his Endocrinologist in [place].     --Will follow patient during hospitalization--  Ambrose FinlandJeannine Johnston Kolbey Teichert RN, MSN, CDE Diabetes Coordinator Inpatient Glycemic Control Team Team Pager: [phone] (8a-5p)

## 2018-01-28 NOTE — Discharge Summary (Addendum)
PATIENT DETAILS Name: Luke King Age: 62 y.o. Sex: male Date of Birth: 1955/12/02 MRN: [id number]. Admitting Physician: Renae Fickle, MD WUJ:[id number], Pcp Not In  Admit Date: 01/25/2018 Discharge date: 01/28/2018  Recommendations for Outpatient Follow-up:  1. Follow up with PCP in 1-2 weeks 2. Please obtain BMP/CBC in one week 3. Xarelto/nonsteroidal anti-inflammatory medication/aspirin held until further GI workup can be completed by his primary gastroenterologist in [place].  Admitted From:  Home  Disposition: Home   Home Health: No  Equipment/Devices: None  Discharge Condition: Stable  CODE STATUS: FULL CODE  Diet recommendation:  Heart Healthy / Carb Modified  Brief Summary: See H&P, Labs, Consult and Test reports for all details in brief,Luke King a 62 year old male with medical history of ulcerative colitis, pyoderma gangrenosum on Remicade, diabetes mellitus, hypertension who presented to the emergency department complaining of left leg pain and weakness.  Patient is from [place] and gets all of his medical care there, he was in town for the furniture show in [place]. Patient has been seeing wound care for pyoderma gangrenosum and had a recent debridement about 4 days ago. Upon ED evaluation patient was found to be afebrile, with sinus tachycardia, soft blood pressure. Lab workup revealed creatinine of 1.5 which baseline is unknown, hemoglobin of 5.5, FOBT positive. An x-ray of the lower extremity with no signs of osteomyelitis. Patient was admitted with working diagnosis of superimposed cellulitis on pyoderma gangrenosum lesion complicated with symptomatic anemiaand acute renal failure.  Brief Hospital Course: Anemia likely secondary to subacute/occult blood loss probably due to GI bleeding: Anemia was mostly a incidental finding-he presented to the emergency room for evaluation of left leg pain.  Per patient, he was recently started on Xarelto-and he  is on on almost daily dosing of Aleve for leg pain.  This morning he claims to me that his stools are mostly brown in color, but a week ago or so it was dark.  I suspect he may have occult GI bleeding that has since resolved.  Although his FOBT is positive-do not think he is actively bleeding at this time-as he claims his stools are brown in color. Furthermore his hemoglobin has now been stable-he was transfused 3 units of PRBC on admission.  Patient does not want to pursue any further workup/endoscopic evaluation during this hospital stay-he would rather go to his primary gastroenterologist and other MDs that are familiar with him in [place].  He claims that he has a flight that he needs to catch tomorrow-and is wanting to be discharged today.  Since he is not having active GI bleeding-his hemoglobin has been relatively stable over the past 24 hours-we will place him on a PPI and discharge him at his own request.  Left lower extremity cellulitis superimposed on pyoderma gangrenosum: He was treated with empiric antimicrobial therapy-on discharge we will switch him over to doxycycline for a few more days.  He is to continue wound care as previous.  He apparently follows with dermatology and vascular surgery in [place].  Mildly elevated troponin: Trend is flat-not consistent with ACS.  Echo showed preserved EF.  No further workup recommended at this time  Ulcerative colitis: Patient is to resume 6-MP, balsalazide and budesonide. he follows with GI in [place]-and claims to me that he has an appointment next week.  See above regarding concern for GI bleeding.  Sinus tachycardia with PACs: Telemetry strips were reviewed by cardiology who did not think patient had atrial fibrillation.  Recommendations are not to resume Xarelto unless he has documented atrial fibrillation or a venous thromboembolism.  Per patient-his vascular surgeon recently started him on Xarelto-he denies a history of atrial fibrillation, of  venous thromboembolism.  Although a echocardiogram done during his hospital stay-was suggestive of a possible thrombus-this was reviewed by cardiology-and it most likely represents a calcification-as noted above-cardiology does not recommend anticoagulation in the setting.  Insulin-dependent DM-2: Managed with an insulin pump in the outpatient setting-due to hypoglycemia-the pump was removed during this hospital stay and the patient was started on Levemir and sliding scale insulin.  Sugars are now stable-patient is absolutely adamant about not using Levemir-he wants to go back on his usual dosing of insulin pump.  I spoke with the diabetic coordinator-subsequently patient setting on the pump has been adjusted-patient will now be on 50% of his basal regimen until he is seen by his primary endocrinologist.  I suspect patient has high requirements for insulin in the outpatient setting likely due to noncompliance with diet.  Patient is very familiar with his insulin pump-he is also very familiar with necessary interventions to do in case he has hypoglycemic episodes.  He is aware of the life-threatening and life disabling consequences of severe hypoglycemia.  Acute kidney injury: Likely hemodynamically mediated-and has improved with supportive care  Procedures/Studies: None  Discharge Diagnoses:  Active Problems:   Symptomatic anemia   Atrial fibrillation with R[facility])   Gastrointestinal hemorrhage   Discharge Instructions:  Activity:  As tolerated with Full fall precautions use walker/cane & assistance as needed  Discharge Instructions    Diet - low sodium heart healthy   Complete by:  As directed    Diet Carb Modified   Complete by:  As directed    Discharge instructions   Complete by:  As directed    Follow with Primary MD, primary endocrinologist and your primary gastroenterologist as soon as possible when you get to [place].  Hold Xarelto until you have seen your primary  gastroenterologist.  Do not take aspirin, nonsteroidal anti-inflammatory medications until you have seen your primary gastroenterologist.  Please check your CBGs (sugars) every 4-6 hours-if your blood sugars are less than 70-please do the necessary interventions for hypoglycemia  Please get a complete blood count and chemistry panel checked by your Primary MD at your next visit, and again as instructed by your Primary MD.  Get Medicines reviewed and adjusted: Please take all your medications with you for your next visit with your Primary MD  Laboratory/radiological data: Please request your Primary MD to go over all hospital tests and procedure/radiological results at the follow up, please ask your Primary MD to get all Hospital records sent to his/her office.  In some cases, they will be blood work, cultures and biopsy results pending at the time of your discharge. Please request that your primary care M.D. follows up on these results.  Also Note the following: If you experience worsening of your admission symptoms, develop shortness of breath, life threatening emergency, suicidal or homicidal thoughts you must seek medical attention immediately by calling 911 or calling your MD immediately  if symptoms less severe.  You must read complete instructions/literature along with all the possible adverse reactions/side effects for all the Medicines you take and that have been prescribed to you. Take any new Medicines after you have completely understood and accpet all the possible adverse reactions/side effects.   Do not drive when taking Pain medications or sleeping medications (Benzodaizepines)  Do  not take more than prescribed Pain, Sleep and Anxiety Medications. It is not advisable to combine anxiety,sleep and pain medications without talking with your primary care practitioner  Special Instructions: If you have smoked or chewed Tobacco  in the last 2 yrs please stop smoking, stop any regular  Alcohol  and or any Recreational drug use.  Wear Seat belts while driving.  Please note: You were cared for by a hospitalist during your hospital stay. Once you are discharged, your primary care physician will handle any further medical issues. Please note that NO REFILLS for any discharge medications will be authorized once you are discharged, as it is imperative that you return to your primary care physician (or establish a relationship with a primary care physician if you do not have one) for your post hospital discharge needs so that they can reassess your need for medications and monitor your lab values.   Increase activity slowly   Complete by:  As directed      Allergies as of 01/28/2018      Reactions   Contrast Media [iodinated Diagnostic Agents] Anaphylaxis   Can't breathe/throat closes up   Penicillins Anaphylaxis   Has patient had a PCN reaction causing immediate rash, facial/tongue/throat swelling, SOB or lightheadedness with hypotension: Yes Has patient had a PCN reaction causing severe rash involving mucus membranes or skin necrosis: No Has patient had a PCN reaction that required hospitalization: No Has patient had a PCN reaction occurring within the last 10 years: No If all of the above answers are "NO", then may proceed with Cephalosporin use. Pt reports "golf ball" in his throat around age 46.   Red Dye Anaphylaxis      Medication List    STOP taking these medications   aspirin 325 MG tablet   naproxen sodium 220 MG tablet Commonly known as:  ALEVE   rivaroxaban 20 MG Tabs tablet Commonly known as:  XARELTO     TAKE these medications   ALPRAZolam 1 MG tablet Commonly known as:  XANAX Take 1 mg by mouth 2 (two) times daily.   atorvastatin 10 MG tablet Commonly known as:  LIPITOR Take 10 mg by mouth daily.   balsalazide 750 MG capsule Commonly known as:  COLAZAL Take 2,250 mg by mouth 3 (three) times daily.   budesonide 3 MG 24 hr capsule Commonly  known as:  ENTOCORT EC Take 9 mg by mouth daily.   bumetanide 1 MG tablet Commonly known as:  BUMEX Take 1 mg by mouth 2 (two) times daily.   collagenase ointment Commonly known as:  SANTYL Apply 1 application topically daily.   doxycycline 100 MG tablet Commonly known as:  VIBRA-TABS Take 1 tablet (100 mg total) by mouth 2 (two) times daily.   gabapentin 800 MG tablet Commonly known as:  NEURONTIN Take 800 mg by mouth 3 (three) times daily.   hydrocortisone 2.5 % cream Apply 1 application topically 2 (two) times daily.   insulin pump Soln Inject 1 each into the skin continuous. humalog 5 units evey hour . Pt inj 140 units daily-but dosage has been reduced to 50% of his basal regimen on the day of discharge What changed:  additional instructions   lisinopril-hydrochlorothiazide 10-12.5 MG tablet Commonly known as:  PRINZIDE,ZESTORETIC Take 1 tablet by mouth daily.   mercaptopurine 50 MG tablet Commonly known as:  PURINETHOL Take 75 mg by mouth daily. Give on an empty stomach 1 hour before or 2 hours after meals. Caution: Chemotherapy.  pantoprazole 40 MG tablet Commonly known as:  PROTONIX Take 1 tablet (40 mg total) by mouth 2 (two) times daily.   potassium chloride 10 MEQ tablet Commonly known as:  K-DUR,KLOR-CON Take 10 mEq by mouth 2 (two) times daily.   tacrolimus 0.1 % ointment Commonly known as:  PROTOPIC Apply 1 application topically 2 (two) times daily.       Allergies  Allergen Reactions  . Contrast Media [Iodinated Diagnostic Agents] Anaphylaxis    Can't breathe/throat closes up  . Penicillins Anaphylaxis    Has patient had a PCN reaction causing immediate rash, facial/tongue/throat swelling, SOB or lightheadedness with hypotension: Yes Has patient had a PCN reaction causing severe rash involving mucus membranes or skin necrosis: No Has patient had a PCN reaction that required hospitalization: No Has patient had a PCN reaction occurring within the  last 10 years: No If all of the above answers are "NO", then may proceed with Cephalosporin use. Pt reports "golf ball" in his throat around age 35.   . Red Dye Anaphylaxis    Consultations:   cardiology  Other Procedures/Studies: Dg Tibia/fibula Left  Result Date: 01/25/2018 CLINICAL DATA:  62 y/o M; nonhealing wound distal to the lateral aspect of the distal fibula. EXAM: LEFT TIBIA AND FIBULA - 2 VIEW COMPARISON:  None. FINDINGS: There is no evidence of fracture or other focal bone lesions. Soft tissue defect lateral to distal fibula. Extensive vascular calcifications. Partially visualized knee and ankle joints appear well maintained. IMPRESSION: Soft tissue defect lateral to the distal fibula. No findings of osteomyelitis. Electronically Signed   By: Mitzi Hansen M.D.   On: 01/25/2018 03:06     TODAY-DAY OF DISCHARGE:  Subjective:   Karie Schwalbe today has no headache,no chest abdominal pain,no new weakness tingling or numbness, feels much better wants to go home today.   Objective:   Blood pressure (!) 160/76, pulse (!) 107, temperature 98.6 F (37 C), temperature source Oral, resp. rate 20, height 5\' 9"  (1.753 m), weight 96.3 kg (212 lb 6.4 oz), SpO2 92 %.  Intake/Output Summary (Last 24 hours) at 01/28/2018 1110 Last data filed at 01/27/2018 2200 Gross per 24 hour  Intake 1200 ml  Output -  Net 1200 ml   Filed Weights   01/25/18 1254 01/27/18 0414 01/28/18 0625  Weight: 92.1 kg (203 lb 0.7 oz) 94.5 kg (208 lb 5.4 oz) 96.3 kg (212 lb 6.4 oz)    Exam: Awake Alert, Oriented *3, No new F.N deficits, Normal affect [place].AT,PERRAL Supple Neck,No JVD, No cervical lymphadenopathy appriciated.  Symmetrical Chest wall movement, Good air movement bilaterally, CTAB RRR,No Gallops,Rubs or new Murmurs, No Parasternal Heave +ve B.Sounds, Abd Soft, Non tender, No organomegaly appriciated, No rebound -guarding or rigidity. No Cyanosis, Clubbing or edema, No new Rash or  bruise   PERTINENT RADIOLOGIC STUDIES: Dg Tibia/fibula Left  Result Date: 01/25/2018 CLINICAL DATA:  62 y/o M; nonhealing wound distal to the lateral aspect of the distal fibula. EXAM: LEFT TIBIA AND FIBULA - 2 VIEW COMPARISON:  None. FINDINGS: There is no evidence of fracture or other focal bone lesions. Soft tissue defect lateral to distal fibula. Extensive vascular calcifications. Partially visualized knee and ankle joints appear well maintained. IMPRESSION: Soft tissue defect lateral to the distal fibula. No findings of osteomyelitis. Electronically Signed   By: Mitzi Hansen M.D.   On: 01/25/2018 03:06     PERTINENT LAB RESULTS: CBC: Recent Labs    01/27/18 0555 01/28/18 0501  WBC 8.6 8.2  HGB 8.3* 8.5*  HCT 26.1* 26.9*  PLT 226 236   CMET CMP     Component Value Date/Time   NA 137 01/28/2018 0501   K 3.6 01/28/2018 0501   CL 101 01/28/2018 0501   CO2 24 01/28/2018 0501   GLUCOSE 80 01/28/2018 0501   BUN 11 01/28/2018 0501   CREATININE 0.69 01/28/2018 0501   CALCIUM 8.2 (L) 01/28/2018 0501   GFRNONAA >60 01/28/2018 0501   GFRAA >60 01/28/2018 0501    GFR Estimated Creatinine Clearance: 109.6 mL/min (by C-G formula based on SCr of 0.69 mg/dL). No results for input(s): LIPASE, AMYLASE in the last 72 hours. Recent Labs    01/25/18 2214 01/26/18 0508 01/26/18 1132  TROPONINI <0.03 0.03* <0.03   Invalid input(s): POCBNP No results for input(s): DDIMER in the last 72 hours. Recent Labs    01/25/18 2214  HGBA1C 5.9*   No results for input(s): CHOL, HDL, LDLCALC, TRIG, CHOLHDL, LDLDIRECT in the last 72 hours. Recent Labs    01/25/18 2214  TSH 4.913*   No results for input(s): VITAMINB12, FOLATE, FERRITIN, TIBC, IRON, RETICCTPCT in the last 72 hours. Coags: No results for input(s): INR in the last 72 hours.  Invalid input(s): PT Microbiology: Recent Results (from the past 240 hour(s))  Blood Cultures x 2 sites     Status: None (Preliminary  result)   Collection Time: 01/25/18  9:58 PM  Result Value Ref Range Status   Specimen Description   Final    BLOOD RIGHT ANTECUBITAL Performed at [facility], [address] [place], [place] [address]    Special Requests   Final    BOTTLES DRAWN AEROBIC AND ANAEROBIC Blood Culture adequate volume Performed at [facility], [address] [place], [place] [address]    Culture   Final    NO GROWTH 2 DAYS Performed at [facility], [address], [place], [place] [address]    Report Status PENDING  Incomplete  Blood Cultures x 2 sites     Status: None (Preliminary result)   Collection Time: 01/25/18 10:04 PM  Result Value Ref Range Status   Specimen Description   Final    BLOOD LEFT ANTECUBITAL Performed at [facility], [address] [place], [place] [address]    Special Requests   Final    BOTTLES DRAWN AEROBIC AND ANAEROBIC Blood Culture adequate volume Performed at [facility], [address] [place], [place] [address]    Culture   Final    NO GROWTH 2 DAYS Performed at [facility], [address] [place], [place] [address]    Report Status PENDING  Incomplete    FURTHER DISCHARGE INSTRUCTIONS:  Get Medicines reviewed and adjusted: Please take all your medications with you for your next visit with your Primary MD  Laboratory/radiological data: Please request your Primary MD to go over all hospital tests and procedure/radiological results at the follow up, please ask your Primary MD to get all Hospital records sent to his/her office.  In some cases, they will be blood work, cultures and biopsy results pending at the time of your discharge. Please request that your primary care M.D. goes through all the records of your hospital data and follows up on these results.  Also Note the following: If you experience worsening of your admission symptoms, develop shortness of breath,  life threatening emergency, suicidal or homicidal thoughts you must seek medical attention immediately by  calling 911 or calling your MD immediately  if symptoms less severe.  You must read complete instructions/literature along with all the possible adverse reactions/side effects for all the Medicines you take and that have been prescribed to you. Take any new Medicines after you have completely understood and accpet all the possible adverse reactions/side effects.   Do not drive when taking Pain medications or sleeping medications (Benzodaizepines)  Do not take more than prescribed Pain, Sleep and Anxiety Medications. It is not advisable to combine anxiety,sleep and pain medications without talking with your primary care practitioner  Special Instructions: If you have smoked or chewed Tobacco  in the last 2 yrs please stop smoking, stop any regular Alcohol  and or any Recreational drug use.  Wear Seat belts while driving.  Please note: You were cared for by a hospitalist during your hospital stay. Once you are discharged, your primary care physician will handle any further medical issues. Please note that NO REFILLS for any discharge medications will be authorized once you are discharged, as it is imperative that you return to your primary care physician (or establish a relationship with a primary care physician if you do not have one) for your post hospital discharge needs so that they can reassess your need for medications and monitor your lab values.  Total Time spent coordinating discharge including counseling, education and face to face time equals  45 minutes.  Signed: Shanker Ghimire 01/28/2018 11:10 AM

## 2018-01-28 NOTE — Progress Notes (Signed)
Seen and examined Requests he be discharged home today-he has a flight back to [place] tomorrow He denies black tarry stools, hemetemesis over the past few days He claims he follows with GI, Derm, Hematology and Endo in [place] He [facility]-he claims he has appts with his MD's in [place] in the next week or so. His CBG's are now stable-suspect he requires higher Insulin dosage as outpatient because he is non compliant with diet-he has tried Levemir in the past-and does NOT want to go back on it, he wants to go back on the pump-spoke with Diabetic Coordinator-we will reduce his basal rate by 50 percent and put him back on the pump. Patient is very familiar with the pump, and is very familiar with hypoglycemic interventions. Will monitor him for a few hours when he is the pump-before discharge later today Full note to follow.

## 2018-01-28 NOTE — Progress Notes (Signed)
Home DM Meds:Insulin Pump Basal - 12MN - 8AM - 3 units/hour  8AM - 12 MN - 5 units/hour Total basal=104 units/24 hours Bolus - 5 units at B, L, D    Current Insulin Orders:Levemir 35 units QHS Novolog Sensitive Correction Scale/ SSI (0-9 units)Q4 hours    Spoke with Dr. Sloan Leiter by phone earlier this AM.  Dr. Sloan Leiter stated pt wants to go home today and patient does not want to take insulin injections.  Patient wants to resume his insulin pump.  Discussed with Dr. Sloan Leiter that pt usually gets 104 units insulin as a basal rate on his pump for a 24 hour period.  Was having severe Hypoglycemia so pt took insulin pump off on the night of 04/08.  Basal rate was replaced with Levemir 50 units BID.  Patient continued to have Hypoglycemia with the Levemir at 50 units BID, so Levemir was reduced to 35 units QHS.  Patient only received 35 units Levemir last PM (04/10) and CBG this AM was 88 mg/dl.  Dr. Sloan Leiter and I discussed having pt put his pump back on but reducing his basal rates on his pump by 50% until he can see his Endocrinologist in [place].  Met with pt this AM.  Discussed the above information.  Pt was upset and had an angry tone with me at first.  Stated to me that we (the hospital) were making his sugars low b/c we were giving him Levemir.  When I stated that we needed to replace his basal rates with long-acting basal insulin b/c he took his pump off, pt told me "you guys took my pump and gave me Levemir which made my sugars drop then you wouldn't let me eat anything but liquids".  Per the notes from the DM Coordinator note on 04/09, RN told the DM Coordinator that the patient took his pump off on his own accord on the night of April 8th b/c his CBGs were low.  Reviewed pt's CBGs with him for the last  several days (wife of pt was on the phone on speaker and present for our conversation).  Discussed w/ pt that he only received 35 units of Levemir last PM and that his CBG this AM was 88 mg/dl.  Discussed with pt that both myself and Dr. Sloan Leiter are concerned that the basal rates on his pump may be too high and may cause Hypoglycemia if he resumes them.  Dr. Sloan Leiter would like for pt to use a temporary basal rate at 50% previous basal rate until pt can get home to see his Endocrinologist for further follow-up.  Wife stated she is trying to make an appt for this Friday with ENDO.   Watched and verbally assisted pt to create a second basal pattern on his pump at 50% total previous rates.  Midnight rate was reduced to 1.5 units/hr and 8am rate was reduced to 2.5 units/hr.  Had pt's RN Deidre Ala review the new basal pattern with both myself and the pt.  Pt resumed his pump at ~11am with all new pump supplies and at the basal pattern #2 which is 50% of the previous basal rates.  CBG was 90 mg/dl at 12pm.  Pt then requested a print out of all his CBGs taken here in the hospital so he can share them with his ENDO.  Provided pt with the requested data.  Pt went onto tell me that his ENDO "is a moron and doesn't know how to use or read a pump".  Pt also told me he was prescribed a sensor but he won't wear it b/c he "doesn't want to look like a robot".  Encouraged pt to use his censor as the censor will help gather more glucose data and will allow the ENDO to detect patterns in glucose levels which could ultimately help with insulin pump adjustments.  Additionally discussed with pt that he does have Levemir 35 units on board from last PM.  Do not want the Levemir and the basal rates on the pump to overlap and cause Hypoglycemia.  Pt stated he knows how to suspend his pump if needed if he has HYPO events.  Explained to pt that if he suspends his basal rates, that he should resume them around 8pm tonight after the Levemir  has metabolized out of his body.  Reviewed signs and symptoms of HYPO with pt.  Pt stated he knows what HYPO feels like and knows what to do.  Plans to go to [facility] after leaving the hospital and buy some glucose tablets.  Also stated he has Hershey bars in his suitcase.  I asked the Rn to please give him a small can of regular soda to take with him as well.     --Will follow patient during hospitalization--  Wyn Quaker RN, MSN, CDE Diabetes Coordinator Inpatient Glycemic Control Team Team Pager: [phone] (8a-5p)

## 2018-01-30 LAB — CULTURE, BLOOD (ROUTINE X 2)
Culture: NO GROWTH
Culture: NO GROWTH
SPECIAL REQUESTS: ADEQUATE
Special Requests: ADEQUATE

## 2019-07-28 IMAGING — DX DG TIBIA/FIBULA 2V*L*
2 series · 2 of 2 positions shown · non-contrast
Comparison: None.

CLINICAL DATA: 61 y/o M; nonhealing wound distal to the lateral
aspect of the distal fibula.

EXAM:
LEFT TIBIA AND FIBULA - 2 VIEW

[tibia ap]
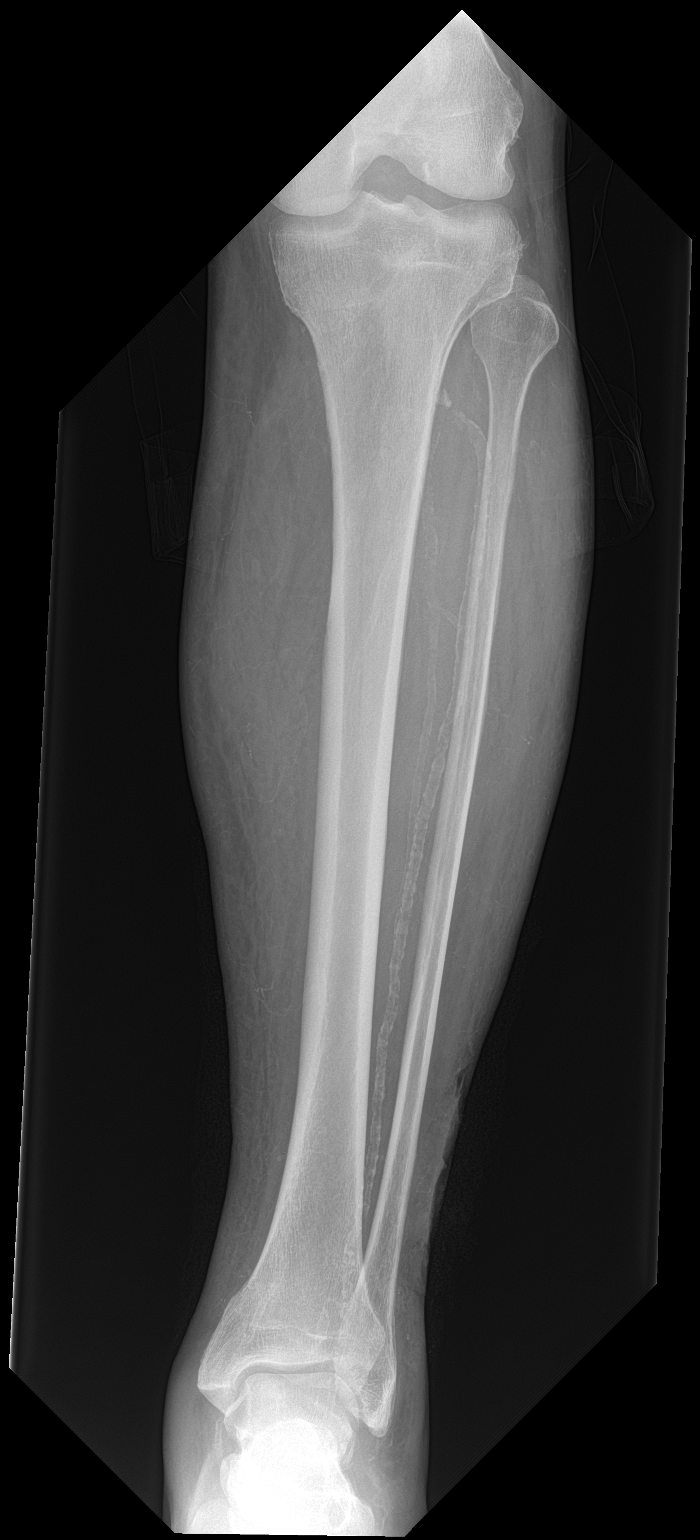

[tibia lat]
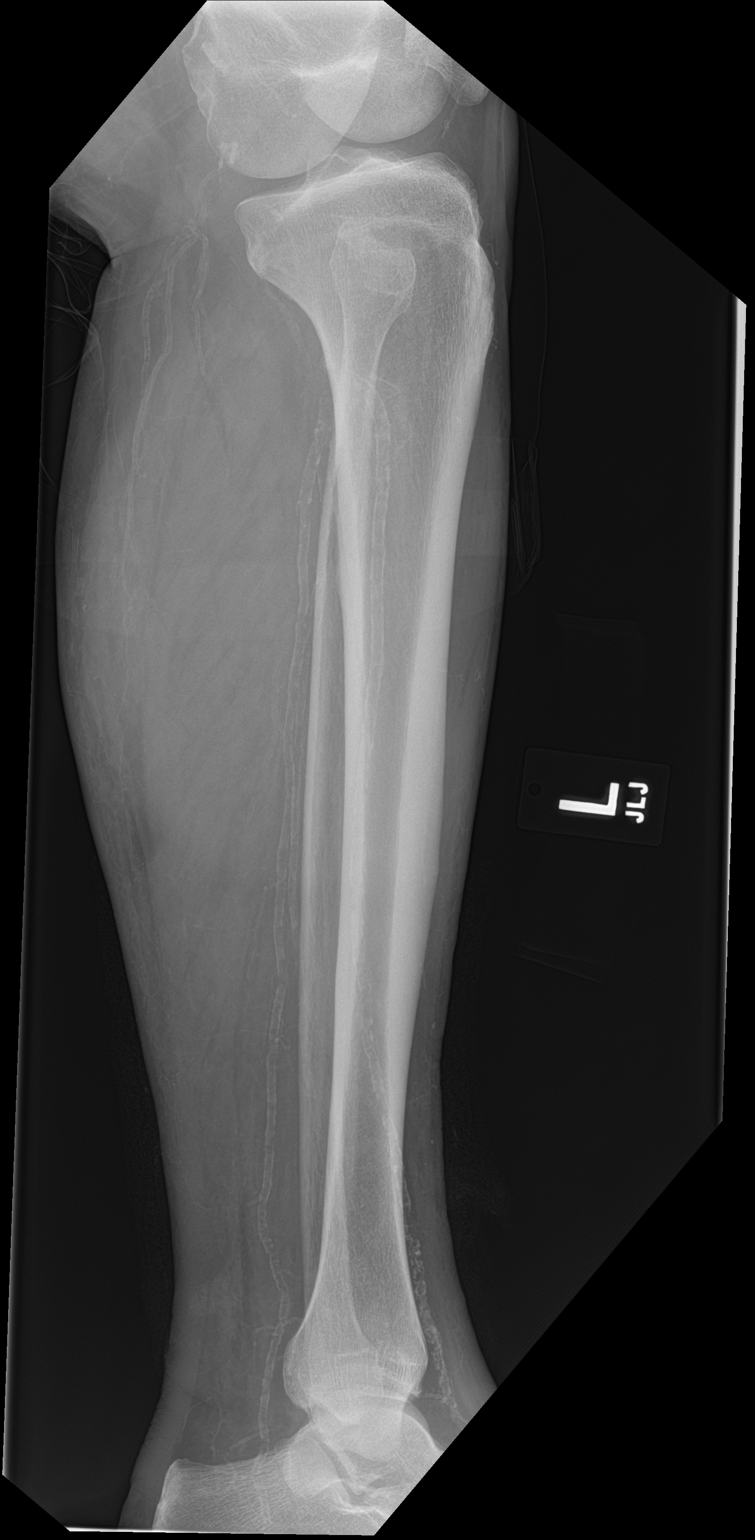

[2 of 2 positions shown; findings below may reference images not displayed]

FINDINGS: There is no evidence of fracture or other focal bone lesions. Soft
tissue defect lateral to distal fibula. Extensive vascular
calcifications. Partially visualized knee and ankle joints appear
well maintained.
IMPRESSION: Soft tissue defect lateral to the distal fibula. No findings of
osteomyelitis.

By: Quirijn Amazigh M.D.
